# Patient Record
Sex: Female | Born: 1995 | Race: Black or African American | Hispanic: No | Marital: Single | Smoking: Never smoker
Health system: Southern US, Community
[De-identification: ages and names within clinical notes are randomized; demographics above are authoritative.]

## PROBLEM LIST (undated history)

## (undated) DIAGNOSIS — J45909 Unspecified asthma, uncomplicated: Secondary | ICD-10-CM

## (undated) HISTORY — PX: NO PAST SURGERIES: SHX2092

---
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
## 2015-08-07 ENCOUNTER — Encounter (HOSPITAL_COMMUNITY): Payer: Self-pay | Admitting: *Deleted

## 2015-08-07 ENCOUNTER — Inpatient Hospital Stay (HOSPITAL_COMMUNITY)
Admission: AD | Admit: 2015-08-07 | Discharge: 2015-08-07 | Disposition: A | Discharge status: Home / Self Care | Payer: Medicaid Other | Source: Ambulatory Visit | Attending: Obstetrics and Gynecology | Admitting: Obstetrics and Gynecology

## 2015-08-07 DIAGNOSIS — N926 Irregular menstruation, unspecified: Secondary | ICD-10-CM | POA: Insufficient documentation

## 2015-08-07 DIAGNOSIS — Z3202 Encounter for pregnancy test, result negative: Secondary | ICD-10-CM | POA: Diagnosis not present

## 2015-08-07 DIAGNOSIS — N898 Other specified noninflammatory disorders of vagina: Secondary | ICD-10-CM | POA: Diagnosis present

## 2015-08-07 DIAGNOSIS — J45909 Unspecified asthma, uncomplicated: Secondary | ICD-10-CM | POA: Insufficient documentation

## 2015-08-07 HISTORY — DX: Unspecified asthma, uncomplicated: J45.909

## 2015-08-07 LAB — URINALYSIS, ROUTINE W REFLEX MICROSCOPIC
BILIRUBIN URINE: NEGATIVE
GLUCOSE, UA: NEGATIVE mg/dL
Ketones, ur: NEGATIVE mg/dL
Leukocytes, UA: NEGATIVE
Nitrite: NEGATIVE
PROTEIN: NEGATIVE mg/dL
SPECIFIC GRAVITY, URINE: 1.015 (ref 1.005–1.030)
pH: 6.5 (ref 5.0–8.0)

## 2015-08-07 LAB — POCT PREGNANCY, URINE: Preg Test, Ur: NEGATIVE

## 2015-08-07 LAB — URINE MICROSCOPIC-ADD ON

## 2015-08-07 LAB — WET PREP, GENITAL
Clue Cells Wet Prep HPF POC: NONE SEEN
SPERM: NONE SEEN
Trich, Wet Prep: NONE SEEN
Yeast Wet Prep HPF POC: NONE SEEN

## 2015-08-07 NOTE — MAU Provider Note (Signed)
Chief Complaint: Vaginal Bleeding   First Provider Initiated Contact with Patient 08/07/15 1027     SUBJECTIVE HPI: Briscoe BurnsJashana Randolph is a [age] y.o. female who presents to [facility] reporting dark menstrual blood w/ something mixed in with it. LMP started yesterday, normal time. Only sexually active with female partner.  Severity: Moderate Duration: <24 hours Associated signs and symptoms: Pos for vaginal discharge, small clots. Neg for fever, chills, abd pain, vaginal irritation, vaginal odor.   Past Medical History  Diagnosis Date  . Asthma    OB History  No data available   Past Surgical History  Procedure Laterality Date  . No past surgeries     Social History   Social History  . Marital Status: Single    Spouse Name: N/A  . Number of Children: N/A  . Years of Education: N/A   Occupational History  . Not on file.   Social History Main Topics  . Smoking status: Never Smoker   . Smokeless tobacco: Not on file  . Alcohol Use: No  . Drug Use: No  . Sexual Activity: Not on file   Other Topics Concern  . Not on file   Social History Narrative  . No narrative on file   No current facility-administered medications on file prior to encounter.   No current outpatient prescriptions on file prior to encounter.   No Known Allergies  I have reviewed the past Medical Hx, Surgical Hx, Social Hx, Allergies and Medications.   Review of Systems  Constitutional: Negative for fever and chills.  Gastrointestinal: Negative for abdominal pain.  Genitourinary: Positive for vaginal bleeding, vaginal discharge and menstrual problem. Negative for genital sores, vaginal pain and pelvic pain.  Neurological: Negative for dizziness.    OBJECTIVE Patient Vitals for the past 24 hrs:  BP Temp Temp src Pulse Resp Height Weight  08/07/15 1115 102/71 mmHg - - 65 - - -  08/07/15 0954 98/65 mmHg 97.9 F (36.6 C) Oral 67 16 - -  08/07/15 0948 - - - - - 5\' 8"  (1.727 m) 127 lb 12.8  oz (57.97 kg)   Constitutional: Well-developed, well-nourished female in no acute distress.  Cardiovascular: normal rate Respiratory: normal rate and effort.  GI: Abd soft, non-tender. MS: Extremities nontender, no edema, normal ROM Neurologic: Alert and oriented x 4.  GU:  SPECULUM EXAM: NEFG, small amount of brown blood noted, cervix clean  BIMANUAL: cervix closed; uterus normal size, no adnexal tenderness or masses. No CMT.  LAB RESULTS Results for orders placed or performed during the hospital encounter of 08/07/15 (from the past 24 hour(s))  Wet prep, genital     Status: Abnormal   Collection Time: 08/07/15 10:35 AM  Result Value Ref Range   Yeast Wet Prep HPF POC NONE SEEN NONE SEEN   Trich, Wet Prep NONE SEEN NONE SEEN   Clue Cells Wet Prep HPF POC NONE SEEN NONE SEEN   WBC, Wet Prep HPF POC FEW (A) NONE SEEN   Sperm NONE SEEN   Urinalysis, Routine w reflex microscopic (not [facility])     Status: Abnormal   Collection Time: 08/07/15 10:45 AM  Result Value Ref Range   Color, Urine YELLOW YELLOW   APPearance CLEAR CLEAR   Specific Gravity, Urine 1.015 1.005 - 1.030   pH 6.5 5.0 - 8.0   Glucose, UA NEGATIVE NEGATIVE mg/dL   Hgb urine dipstick LARGE (A) NEGATIVE   Bilirubin Urine NEGATIVE NEGATIVE   Ketones, ur NEGATIVE NEGATIVE mg/dL  Protein, ur NEGATIVE NEGATIVE mg/dL   Nitrite NEGATIVE NEGATIVE   Leukocytes, UA NEGATIVE NEGATIVE  Urine microscopic-add on     Status: Abnormal   Collection Time: 08/07/15 10:45 AM  Result Value Ref Range   Squamous Epithelial / LPF 0-5 (A) NONE SEEN   WBC, UA 0-5 0 - 5 WBC/hpf   RBC / HPF 0-5 0 - 5 RBC/hpf   Bacteria, UA FEW (A) NONE SEEN  Pregnancy, urine POC     Status: None   Collection Time: 08/07/15 10:50 AM  Result Value Ref Range   Preg Test, Ur NEGATIVE NEGATIVE    IMAGING No results found.  MAU COURSE CBC, pelvic exam, Wet prep, GC/Chlamydia cultures.  MDM - Small amount of dark menstrual blood, but exam otherwise  normal. Unsure why and stroke blood is darker than usual, but no evidence of infection or emergent condition.  ASSESSMENT 1. Abnormal menstruation     PLAN Discharge home in stable condition. Patient could not stay for results because she needed to get to work. Will call with any positive results. List of providers given. Discussed indications for ED visits and encourage patient to utilize gynecologist for nonemergent GYN care. Follow-up Information    Follow up with Gynecologist .   Why:  For non-emergent gynecology care      Follow up with THE [facility] [place] [facility]ERN[facility] ADMISS[facility].   Why:  As needed in emergencies   Contact information:   [address] [id number] mc [place] [address] [phone]          Follow-up Information    Follow up with Gynecologist .   Why:  For non-emergent gynecology care      Follow up with THE [facility] [place] [facility]ERN[facility] ADMISS[facility].   Why:  As needed in emergencies   Contact information:   [address] [id number] mc [place] [address] [phone]       Medication List    Notice    You have not been prescribed any medications.       [place], [place] 08/07/2015  8:23 PM

## 2015-08-07 NOTE — Discharge Instructions (Signed)

## 2015-08-07 NOTE — MAU Note (Signed)
Pt states her period color is not normal.  Pt states it is dark brown.  Pt states there are some small clots but there is also something else mixed in with the blood and she is unsure of what it is.  Pt states her period started yesterday.

## 2015-08-08 LAB — HIV ANTIBODY (ROUTINE TESTING W REFLEX): HIV Screen 4th Generation wRfx: NONREACTIVE

## 2015-08-09 LAB — GC/CHLAMYDIA PROBE AMP (~~LOC~~) NOT AT ARMC
Chlamydia: NEGATIVE
Neisseria Gonorrhea: NEGATIVE

## 2017-08-10 ENCOUNTER — Encounter (HOSPITAL_COMMUNITY): Payer: Self-pay

## 2017-08-10 ENCOUNTER — Telehealth (HOSPITAL_COMMUNITY): Payer: Self-pay

## 2017-08-10 ENCOUNTER — Ambulatory Visit (HOSPITAL_COMMUNITY)
Admission: EM | Admit: 2017-08-10 | Discharge: 2017-08-10 | Disposition: A | Discharge status: Home / Self Care | Payer: Self-pay | Attending: Family Medicine | Admitting: Family Medicine

## 2017-08-10 DIAGNOSIS — J4521 Mild intermittent asthma with (acute) exacerbation: Secondary | ICD-10-CM

## 2017-08-10 DIAGNOSIS — B349 Viral infection, unspecified: Secondary | ICD-10-CM

## 2017-08-10 MED ORDER — TRAMADOL HCL 50 MG PO TABS: 50.0000 mg | ORAL_TABLET | Freq: Four times a day (QID) | ORAL | 0 refills | Status: DC | PRN

## 2017-08-10 MED ORDER — PREDNISONE 20 MG PO TABS: 20.0000 mg | ORAL_TABLET | Freq: Two times a day (BID) | ORAL | 0 refills | Status: DC

## 2017-08-10 NOTE — Telephone Encounter (Signed)
Pt requesting pharmacy change

## 2017-08-10 NOTE — Discharge Instructions (Signed)
Continue to rest.  Push fluids. Take tramadol as needed for pain.  Take with food Take prednisone twice a day for 5 days.  This is for the asthma and shortness of breath Continue inhaler as needed Return if not better by Monday

## 2017-08-10 NOTE — ED Provider Notes (Signed)
[facility]    CSN: [id number] Arrival date & time: 08/10/17  0908     History   Chief Complaint Chief Complaint  Patient presents with  . Shortness of Breath    HPI Lori Randolph is a 22 y.o. female.   HPI  Patient is here for a respiratory infection.  She has cough, chest tightness, and some increased wheezing since yesterday.  She started off with cold symptoms last week runny and stuffy nose and a mild sore throat.  No fever or chills.  No sputum production.  She states she does have a history of asthma.  She does have an inhaler at home.  She has been using her inhaler.  She is very tired today.  She is home from work.  She has a headache and some body aches.  No chest pain with deep breathing coughing.  Past Medical History:  Diagnosis Date  . Asthma     There are no active problems to display for this patient.   Past Surgical History:  Procedure Laterality Date  . NO PAST SURGERIES      OB History   None      Home Medications    Prior to Admission medications   Medication Sig Start Date End Date Taking? Authorizing Provider  predniSONE (DELTASONE) 20 MG tablet Take 1 tablet (20 mg total) by mouth 2 (two) times daily with a meal. 08/10/17   Eustace MooreNelson, Yvonne Sue, MD  traMADol (ULTRAM) 50 MG tablet Take 1 tablet (50 mg total) by mouth every 6 (six) hours as needed. 08/10/17   Eustace MooreNelson, Yvonne Sue, MD    Family History Family History  Problem Relation Age of Onset  . Healthy Mother   Asthma in family  Social History Social History   Tobacco Use  . Smoking status: Never Smoker  Substance Use Topics  . Alcohol use: No  . Drug use: No     Allergies   Ibuprofen Patient had face and lip swelling with ibuprofen states she had some difficulty breathing.  We discussed a Toradol shot, and I think it is contraindicated.  Review of Systems Review of Systems  Constitutional: Positive for fatigue. Negative for chills and fever.  HENT: Negative for  congestion, dental problem, ear pain, postnasal drip, rhinorrhea, sinus pain and sore throat.   Eyes: Negative for pain and visual disturbance.  Respiratory: Positive for chest tightness and wheezing. Negative for cough and shortness of breath.   Cardiovascular: Negative for chest pain and palpitations.  Gastrointestinal: Negative for abdominal pain and vomiting.  Genitourinary: Negative for dysuria and hematuria.  Musculoskeletal: Negative for arthralgias and back pain.  Skin: Negative for color change and rash.  Neurological: Positive for headaches. Negative for seizures and syncope.  All other systems reviewed and are negative.    Physical Exam Triage Vital Signs ED Triage Vitals  Enc Vitals Group     BP 08/10/17 0930 124/74     Pulse Rate 08/10/17 0930 72     Resp 08/10/17 0930 18     Temp 08/10/17 0930 97.9 F (36.6 C)     Temp Source 08/10/17 0930 Oral     SpO2 08/10/17 0930 98 %     Weight --      Height --      Head Circumference --      Peak Flow --      Pain Score 08/10/17 0934 8     Pain Loc --  Pain Edu? --      Excl. in GC? --    No data found.  Updated Vital Signs BP 124/74 (BP Location: Right Arm)   Pulse 72   Temp 97.9 F (36.6 C) (Oral)   Resp 18   LMP 08/03/2017   SpO2 98%       Physical Exam  Constitutional: She is oriented to person, place, and time. She appears well-developed and well-nourished. She does not appear ill. No distress.  Appears fatigued  HENT:  Head: Normocephalic and atraumatic.  Mouth/Throat: Oropharynx is clear and moist. No oropharyngeal exudate or posterior oropharyngeal edema.  Eyes: Pupils are equal, round, and reactive to light. Conjunctivae and EOM are normal.  Neck: Normal range of motion.  Cardiovascular: Normal rate, regular rhythm and normal heart sounds.  Pulmonary/Chest: Effort normal. No respiratory distress.  Few anterior wheezes  Abdominal: Soft. She exhibits no distension.  Musculoskeletal: Normal  range of motion. She exhibits no edema.       Right lower leg: Normal.       Left lower leg: Normal.  Lymphadenopathy:    She has no cervical adenopathy.  Neurological: She is alert and oriented to person, place, and time.  Skin: Skin is warm and dry.  Psychiatric: She has a normal mood and affect. Her behavior is normal.     UC Treatments / Results  Labs (all labs ordered are listed, but only abnormal results are displayed) Labs Reviewed - No data to display  EKG None  Radiology No results found.  Procedures Procedures (including critical care time)  Medications Ordered in UC Medications - No data to display  Initial Impression / Assessment and Plan / UC Course  I have reviewed the triage vital signs and the nursing notes.  Pertinent labs & imaging results that were available during my care of the patient were reviewed by me and considered in my medical decision making (see chart for details).      Final Clinical Impressions(s) / UC Diagnoses   Final diagnoses:  Viral illness  Mild intermittent asthma with acute exacerbation     Discharge Instructions     Continue to rest.  Push fluids. Take tramadol as needed for pain.  Take with food Take prednisone twice a day for 5 days.  This is for the asthma and shortness of breath Continue inhaler as needed Return if not better by Monday   ED Prescriptions    Medication Sig Dispense Auth. Provider   predniSONE (DELTASONE) 20 MG tablet Take 1 tablet (20 mg total) by mouth 2 (two) times daily with a meal. 10 tablet Eustace Moore, MD   traMADol (ULTRAM) 50 MG tablet Take 1 tablet (50 mg total) by mouth every 6 (six) hours as needed. 15 tablet Eustace Moore, MD     Controlled Substance Prescriptions [place] Controlled Substance Registry consulted? Yes, I have consulted the [place] Controlled Substances Registry for this patient, and feel the risk/benefit ratio today is favorable for proceeding with this prescription  for a controlled substance.   Eustace Moore, MD 08/10/17 [phone]

## 2017-08-10 NOTE — ED Triage Notes (Signed)
Pt presents with body aches, shortness of breath, chest tightness and stuff nose x 1 day.

## 2017-10-09 ENCOUNTER — Ambulatory Visit (HOSPITAL_COMMUNITY)
Admission: EM | Admit: 2017-10-09 | Discharge: 2017-10-09 | Disposition: A | Discharge status: Home / Self Care | Payer: Medicaid Other | Attending: Family Medicine | Admitting: Family Medicine

## 2017-10-09 ENCOUNTER — Encounter (HOSPITAL_COMMUNITY): Payer: Self-pay | Admitting: Emergency Medicine

## 2017-10-09 DIAGNOSIS — S61001A Unspecified open wound of right thumb without damage to nail, initial encounter: Secondary | ICD-10-CM

## 2017-10-09 DIAGNOSIS — L03011 Cellulitis of right finger: Secondary | ICD-10-CM

## 2017-10-09 MED ORDER — MUPIROCIN 2 % EX OINT: 1.0000 "application " | TOPICAL_OINTMENT | Freq: Two times a day (BID) | CUTANEOUS | 0 refills | Status: DC

## 2017-10-09 MED ORDER — AMOXICILLIN-POT CLAVULANATE 875-125 MG PO TABS: 1.0000 | ORAL_TABLET | Freq: Two times a day (BID) | ORAL | 0 refills | Status: DC

## 2017-10-09 NOTE — ED Provider Notes (Signed)
[facility]    CSN: [id number] Arrival date & time: 10/09/17  1743     History   Chief Complaint Chief Complaint  Patient presents with  . Nail Problem    HPI Lori Randolph is a 22 y.o. female.   22 year old female comes in for possible infection to her right thumbnail.  States she bit off a hangnail yesterday, and had some drainage to the area last night.  Noticed swelling to the area today, and was told to come in for evaluation.  Denies erythema, warmth.  Denies fever, chills, night sweats.  Has not done anything for the symptoms.     Past Medical History:  Diagnosis Date  . Asthma     There are no active problems to display for this patient.   Past Surgical History:  Procedure Laterality Date  . NO PAST SURGERIES      OB History   None      Home Medications    Prior to Admission medications   Medication Sig Start Date End Date Taking? Authorizing Provider  albuterol (PROVENTIL HFA;VENTOLIN HFA) 108 (90 Base) MCG/ACT inhaler Inhale into the lungs every 6 (six) hours as needed for wheezing or shortness of breath.   Yes [provider]  amoxicillin-clavulanate (AUGMENTIN) 875-125 MG tablet Take 1 tablet by mouth every 12 (twelve) hours. 10/09/17   Cathie Hoops, Jauna Raczynski V, PA-C  mupirocin ointment (BACTROBAN) 2 % Apply 1 application topically 2 (two) times daily. 10/09/17   Belinda Fisher, PA-C    Family History Family History  Problem Relation Age of Onset  . Healthy Mother     Social History Social History   Tobacco Use  . Smoking status: Never Smoker  Substance Use Topics  . Alcohol use: No  . Drug use: No     Allergies   Ibuprofen   Review of Systems Review of Systems  Reason unable to perform ROS: See HPI as above.     Physical Exam Triage Vital Signs ED Triage Vitals [10/09/17 1819]  Enc Vitals Group     BP 126/65     Pulse Rate 60     Resp 16     Temp 98 F (36.7 C)     Temp src      SpO2 100 %     Weight      Height        Head Circumference      Peak Flow      Pain Score      Pain Loc      Pain Edu?      Excl. in GC?    No data found.  Updated Vital Signs BP 126/65   Pulse 60   Temp 98 F (36.7 C)   Resp 16   SpO2 100%   Physical Exam  Constitutional: She is oriented to person, place, and time. She appears well-developed and well-nourished. No distress.  HENT:  Head: Normocephalic and atraumatic.  Eyes: Pupils are equal, round, and reactive to light. Conjunctivae are normal.  Musculoskeletal:  Mild swelling to the medial aspect of the right thumb adjacent to the nail bed. No erythema, warmth. No fluctuance felt. Full ROM of finger, sensation intact. Radial pulse 2+, cap refill <2s  Neurological: She is alert and oriented to person, place, and time.  Skin: She is not diaphoretic.     UC Treatments / Results  Labs (all labs ordered are listed, but only abnormal results are displayed) Labs Reviewed -  No data to display  EKG None  Radiology No results found.  Procedures Procedures (including critical care time)  Medications Ordered in UC Medications - No data to display  Initial Impression / Assessment and Plan / UC Course  I have reviewed the triage vital signs and the nursing notes.  Pertinent labs & imaging results that were available during my care of the patient were reviewed by me and considered in my medical decision making (see chart for details).     No paronychia today.  Will have patient use Bactroban ointment, warm compress.  Other wound care instructions given.  Rx of Augmentin provided, patient can fill if worsening symptoms, increased swelling, warmth.  Return precautions given.  Patient expresses understanding and agrees to plan.  Final Clinical Impressions(s) / UC Diagnoses   Final diagnoses:  Paronychia of finger of right hand    ED Prescriptions    Medication Sig Dispense Auth. Provider   mupirocin ointment (BACTROBAN) 2 % Apply 1 application topically  2 (two) times daily. 22 g Dara Beidleman V, PA-C   amoxicillin-clavulanate (AUGMENTIN) 875-125 MG tablet Take 1 tablet by mouth every 12 (twelve) hours. 14 tablet Threasa Alpha, [place] 10/09/17 1844

## 2017-10-09 NOTE — ED Triage Notes (Signed)
Pt here for R thumb nail infection, swelling around nail bed.

## 2017-10-09 NOTE — Discharge Instructions (Signed)
Start bactroban ointment as directed. Warm compress to the area. If with increasing swelling/redness, start augmentin as directed. If symptoms still not improving, has discoloration, follow up for reevaluation needed.

## 2017-10-18 ENCOUNTER — Emergency Department (HOSPITAL_BASED_OUTPATIENT_CLINIC_OR_DEPARTMENT_OTHER): Payer: Medicaid Other

## 2017-10-18 ENCOUNTER — Encounter (HOSPITAL_BASED_OUTPATIENT_CLINIC_OR_DEPARTMENT_OTHER): Payer: Self-pay

## 2017-10-18 ENCOUNTER — Other Ambulatory Visit: Payer: Self-pay

## 2017-10-18 ENCOUNTER — Emergency Department (HOSPITAL_BASED_OUTPATIENT_CLINIC_OR_DEPARTMENT_OTHER)
Admission: EM | Admit: 2017-10-18 | Discharge: 2017-10-19 | Disposition: A | Discharge status: Home / Self Care | Payer: Medicaid Other | Attending: Emergency Medicine | Admitting: Emergency Medicine

## 2017-10-18 DIAGNOSIS — R109 Unspecified abdominal pain: Secondary | ICD-10-CM | POA: Diagnosis present

## 2017-10-18 DIAGNOSIS — Z79899 Other long term (current) drug therapy: Secondary | ICD-10-CM | POA: Diagnosis not present

## 2017-10-18 DIAGNOSIS — B349 Viral infection, unspecified: Secondary | ICD-10-CM | POA: Insufficient documentation

## 2017-10-18 DIAGNOSIS — J45909 Unspecified asthma, uncomplicated: Secondary | ICD-10-CM | POA: Insufficient documentation

## 2017-10-18 DIAGNOSIS — N3001 Acute cystitis with hematuria: Secondary | ICD-10-CM | POA: Insufficient documentation

## 2017-10-18 DIAGNOSIS — J029 Acute pharyngitis, unspecified: Secondary | ICD-10-CM | POA: Insufficient documentation

## 2017-10-18 DIAGNOSIS — A599 Trichomoniasis, unspecified: Secondary | ICD-10-CM | POA: Diagnosis not present

## 2017-10-18 LAB — CBC WITH DIFFERENTIAL/PLATELET
BASOS ABS: 0 10*3/uL (ref 0.0–0.1)
BASOS PCT: 0 %
EOS PCT: 0 %
Eosinophils Absolute: 0 10*3/uL (ref 0.0–0.7)
HCT: 38.7 % (ref 36.0–46.0)
Hemoglobin: 13.2 g/dL (ref 12.0–15.0)
LYMPHS ABS: 1.8 10*3/uL (ref 0.7–4.0)
Lymphocytes Relative: 10 %
MCH: 31.7 pg (ref 26.0–34.0)
MCHC: 34.1 g/dL (ref 30.0–36.0)
MCV: 92.8 fL (ref 78.0–100.0)
Monocytes Absolute: 1.6 10*3/uL — ABNORMAL HIGH (ref 0.1–1.0)
Monocytes Relative: 9 %
NEUTROS PCT: 81 %
Neutro Abs: 14.5 10*3/uL — ABNORMAL HIGH (ref 1.7–7.7)
PLATELETS: 245 10*3/uL (ref 150–400)
RBC: 4.17 MIL/uL (ref 3.87–5.11)
RDW: 12 % (ref 11.5–15.5)
WBC: 18 10*3/uL — AB (ref 4.0–10.5)

## 2017-10-18 LAB — COMPREHENSIVE METABOLIC PANEL
ALT: 9 U/L (ref 0–44)
ANION GAP: 11 (ref 5–15)
AST: 20 U/L (ref 15–41)
Albumin: 4.1 g/dL (ref 3.5–5.0)
Alkaline Phosphatase: 62 U/L (ref 38–126)
BUN: 13 mg/dL (ref 6–20)
CALCIUM: 9 mg/dL (ref 8.9–10.3)
CO2: 24 mmol/L (ref 22–32)
Chloride: 101 mmol/L (ref 98–111)
Creatinine, Ser: 0.79 mg/dL (ref 0.44–1.00)
Glucose, Bld: 126 mg/dL — ABNORMAL HIGH (ref 70–99)
Potassium: 3.1 mmol/L — ABNORMAL LOW (ref 3.5–5.1)
SODIUM: 136 mmol/L (ref 135–145)
Total Bilirubin: 1.2 mg/dL (ref 0.3–1.2)
Total Protein: 7.6 g/dL (ref 6.5–8.1)

## 2017-10-18 LAB — GROUP A STREP BY PCR: GROUP A STREP BY PCR: NOT DETECTED

## 2017-10-18 LAB — PREGNANCY, URINE: PREG TEST UR: NEGATIVE

## 2017-10-18 LAB — LIPASE, BLOOD: LIPASE: 24 U/L (ref 11–51)

## 2017-10-18 MED ORDER — ACETAMINOPHEN 160 MG/5ML PO SOLN
1000.0000 mg | Freq: Once | ORAL | Status: AC
  Administered 2017-10-18: 1000 mg via ORAL
  Filled 2017-10-18: qty 40.6

## 2017-10-18 MED ORDER — DEXAMETHASONE SODIUM PHOSPHATE 10 MG/ML IJ SOLN
10.0000 mg | Freq: Once | INTRAMUSCULAR | Status: AC
  Administered 2017-10-18: 10 mg via INTRAMUSCULAR
  Filled 2017-10-18: qty 1

## 2017-10-18 MED ORDER — SODIUM CHLORIDE 0.9 % IV BOLUS
1000.0000 mL | Freq: Once | INTRAVENOUS | Status: AC
Start: 2017-10-18 — End: 2017-10-18
  Administered 2017-10-18: 1000 mL via INTRAVENOUS

## 2017-10-18 MED ORDER — METHOCARBAMOL 1000 MG/10ML IJ SOLN
500.0000 mg | Freq: Once | INTRAMUSCULAR | Status: AC
  Administered 2017-10-18: 500 mg via INTRAMUSCULAR
  Filled 2017-10-18: qty 10

## 2017-10-18 MED ORDER — IOPAMIDOL (ISOVUE-300) INJECTION 61%
100.0000 mL | Freq: Once | INTRAVENOUS | Status: AC | PRN
  Administered 2017-10-18: 100 mL via INTRAVENOUS

## 2017-10-18 NOTE — ED Notes (Signed)
Advised pt in triage to try to unbundle from her blanket and sweatshirt d/t her fever, pt has elected not to take nurse's advice

## 2017-10-18 NOTE — ED Provider Notes (Signed)
[facility] EMERGENCY DEPARTMENT Provider Note   CSN: [id number] Arrival date & time: 10/18/17  2041     History   Chief Complaint Chief Complaint  Patient presents with  . Sore Throat  . Abdominal Pain    HPI Lori Randolph is a 22 y.o. female with PMH/o asthma presents for evaluation of sore throat, generalized malaise, fevers, cough, nasal congestion, rhinorrhea that has been ongoing since yesterday.  Patient states that it is more painful to swallow she has been able to tolerate her secretions and p.o.  She has been taking Tylenol for sore throat with minimal improvement.  She states she has not had any Tylenol or ibuprofen today prior to ED arrival.  She reports measuring a fever last night at 101.2.  She also reports that she started having some abdominal pain that began last night also.  Patient states she has had some cough but is not productive.  She has just felt weak and rundown all over.  She had some tingling sensation in the left side of her body earlier today but no numbness/weakness.  Patient states she is a history of asthma but does not feel any wheezing or anything.  Patient denies any chest pain, difficulty breathing, nausea/vomiting.  The history is provided by the patient.    Past Medical History:  Diagnosis Date  . Asthma     There are no active problems to display for this patient.   Past Surgical History:  Procedure Laterality Date  . NO PAST SURGERIES       OB History   None      Home Medications    Prior to Admission medications   Medication Sig Start Date End Date Taking? Authorizing Provider  albuterol (PROVENTIL HFA;VENTOLIN HFA) 108 (90 Base) MCG/ACT inhaler Inhale into the lungs every 6 (six) hours as needed for wheezing or shortness of breath.    [provider]  amoxicillin-clavulanate (AUGMENTIN) 875-125 MG tablet Take 1 tablet by mouth every 12 (twelve) hours. 10/09/17   Cathie Hoops, Amy V, PA-C  cephALEXin (KEFLEX) 500 MG  capsule Take 1 capsule (500 mg total) by mouth 2 (two) times daily. 10/19/17   Maxwell Caul, PA-C  metroNIDAZOLE (FLAGYL) 500 MG tablet Take 1 tablet (500 mg total) by mouth 2 (two) times daily for 7 days. 10/19/17 10/26/17  Maxwell Caul, PA-C  mupirocin ointment (BACTROBAN) 2 % Apply 1 application topically 2 (two) times daily. 10/09/17   Belinda Fisher, PA-C    Family History Family History  Problem Relation Age of Onset  . Healthy Mother     Social History Social History   Tobacco Use  . Smoking status: Never Smoker  Substance Use Topics  . Alcohol use: No  . Drug use: No     Allergies   Ibuprofen   Review of Systems Review of Systems  Constitutional: Positive for fatigue and fever.  HENT: Positive for sore throat.   Respiratory: Positive for cough. Negative for shortness of breath.   Cardiovascular: Negative for chest pain.  Gastrointestinal: Negative for abdominal pain, nausea and vomiting.  Genitourinary: Negative for dysuria and hematuria.  Musculoskeletal: Positive for myalgias.  Neurological: Negative for headaches.  All other systems reviewed and are negative.    Physical Exam Updated Vital Signs BP 114/80 (BP Location: Right Arm)   Pulse 77   Temp 98.4 F (36.9 C) (Oral)   Resp 16   Ht 5\' 8"  (1.727 m)   Wt 59 kg  LMP 10/10/2017   SpO2 99%   BMI 19.77 kg/m   Physical Exam  Constitutional: She is oriented to person, place, and time. She appears well-developed and well-nourished.  HENT:  Head: Normocephalic and atraumatic.  Mouth/Throat: Uvula is midline and mucous membranes are normal. No trismus in the jaw. Posterior oropharyngeal edema and posterior oropharyngeal erythema present.  Posterior oropharynx is erythematous, edematous.  Tonsils are symmetric in appearance.  Uvula is midline.  No trismus.  Airways patent, phonation is intact.  Eyes: Pupils are equal, round, and reactive to light. Conjunctivae, EOM and lids are normal.  Neck: Full  passive range of motion without pain.  Cardiovascular: Normal rate, regular rhythm, normal heart sounds and normal pulses. Exam reveals no gallop and no friction rub.  No murmur heard. Pulmonary/Chest: Effort normal and breath sounds normal.  Lungs clear to auscultation bilaterally.  Symmetric chest rise.  No wheezing, rales, rhonchi.  Abdominal: Soft. Normal appearance. There is tenderness. There is tenderness at McBurney's point. There is no rigidity, no guarding and no CVA tenderness.  Abdomen is soft, nondistended.  Tenderness noted at McBurney's point.  No rebounding, guarding.  Negative Rovsing's.  CVA tenderness bilaterally.  Musculoskeletal: Normal range of motion.  Neurological: She is alert and oriented to person, place, and time.  Skin: Skin is warm and dry. Capillary refill takes less than 2 seconds.  Psychiatric: She has a normal mood and affect. Her speech is normal.  Nursing note and vitals reviewed.    ED Treatments / Results  Labs (all labs ordered are listed, but only abnormal results are displayed) Labs Reviewed  COMPREHENSIVE METABOLIC PANEL - Abnormal; Notable for the following components:      Result Value   Potassium 3.1 (*)    Glucose, Bld 126 (*)    All other components within normal limits  CBC WITH DIFFERENTIAL/PLATELET - Abnormal; Notable for the following components:   WBC 18.0 (*)    Neutro Abs 14.5 (*)    Monocytes Absolute 1.6 (*)    All other components within normal limits  URINALYSIS, ROUTINE W REFLEX MICROSCOPIC - Abnormal; Notable for the following components:   APPearance HAZY (*)    Hgb urine dipstick TRACE (*)    Ketones, ur 15 (*)    Protein, ur 30 (*)    Leukocytes, UA MODERATE (*)    All other components within normal limits  URINALYSIS, MICROSCOPIC (REFLEX) - Abnormal; Notable for the following components:   Bacteria, UA FEW (*)    Trichomonas, UA PRESENT (*)    All other components within normal limits  GROUP A STREP BY PCR    LIPASE, BLOOD  PREGNANCY, URINE    EKG None  Radiology Dg Chest 2 View  Result Date: 10/18/2017 CLINICAL DATA:  Cough EXAM: CHEST - 2 VIEW COMPARISON:  None. FINDINGS: The heart size and mediastinal contours are within normal limits. Both lungs are clear. The visualized skeletal structures are unremarkable. IMPRESSION: No active cardiopulmonary disease. Electronically Signed   By: Deatra Robinson M.D.   On: 10/18/2017 21:45   Ct Abdomen Pelvis W Contrast  Result Date: 10/18/2017 CLINICAL DATA:  Abdominal pain with fever EXAM: CT ABDOMEN AND PELVIS WITH CONTRAST TECHNIQUE: Multidetector CT imaging of the abdomen and pelvis was performed using the standard protocol following bolus administration of intravenous contrast. CONTRAST:  ISOVUE-300 IOPAMIDOL (ISOVUE-300) INJECTION 61% COMPARISON:  None. FINDINGS: Lower chest: No acute abnormality. Hepatobiliary: No focal liver abnormality is seen. No gallstones, gallbladder wall thickening, or  biliary dilatation. Pancreas: Unremarkable. No pancreatic ductal dilatation or surrounding inflammatory changes. Spleen: Normal in size without focal abnormality. Adrenals/Urinary Tract: Adrenal glands are unremarkable. Kidneys are normal, without renal calculi, focal lesion, or hydronephrosis. Bladder is unremarkable. Stomach/Bowel: Stomach is within normal limits. Appendix appears normal. No evidence of bowel wall thickening, distention, or inflammatory changes. Vascular/Lymphatic: No significant vascular findings are present. No enlarged abdominal or pelvic lymph nodes. Reproductive: Uterus and bilateral adnexa are unremarkable. Other: Negative for free air.  Trace free fluid in the pelvis. Musculoskeletal: No acute or significant osseous findings. IMPRESSION: 1. No CT evidence for acute intra-abdominal or intrapelvic abnormality. 2. Trace free fluid in the pelvis Electronically Signed   By: Jasmine PangKim  Fujinaga M.D.   On: 10/18/2017 23:39    Procedures Procedures  (including critical care time)  Medications Ordered in ED Medications  acetaminophen (TYLENOL) solution 1,000 mg (1,000 mg Oral Given 10/18/17 2058)  dexamethasone (DECADRON) injection 10 mg (10 mg Intramuscular Given 10/18/17 2150)  methocarbamol (ROBAXIN) injection 500 mg (500 mg Intramuscular Given 10/18/17 2153)  sodium chloride 0.9 % bolus 1,000 mL ( Intravenous Stopped 10/18/17 2355)  iopamidol (ISOVUE-300) 61 % injection 100 mL (100 mLs Intravenous Contrast Given 10/18/17 2326)     Initial Impression / Assessment and Plan / ED Course  I have reviewed the triage vital signs and the nursing notes.  Pertinent labs & imaging results that were available during my care of the patient were reviewed by me and considered in my medical decision making (see chart for details).     22 year old female who presents for evaluation of sore throat, abdominal pain, fever, cough that is been ongoing for last 2 days.  No vomiting, diarrhea. Patient is mild, so a tachycardic.  She appears uncomfortable but no acute distress.. Vital signs reviewed and stable.  Exam, patient has posterior oropharynx erythema, edema.  Uvula is midline.  No trismus.  Consider pharyngitis.  History/physical exam is not concerning for peritonsillar abscess, Ludwig angina.  On abdominal exam, patient has some tenderness noted to right lower quadrant, particularly McBurney's point.  No rigidity, guarding. Rapid strep ordered at triage.  We will plan to check chest x-ray, basic labs.  Given tenderness to right lower quadrant, will plan for CT and pelvis for evaluation of appendicitis.  Strep negative.  Chest x-ray negative for any acute abnormality.  CBC shows leukocytosis of 18.0.  TMP shows potassium 3.1.  Otherwise unremarkable.  Lipase unremarkable.  CT scan shows normal appendix.  No evidence of acute intra-abdominal process.  Discussed urine results with patient.  Offered to speak with her in private without her significant other  here but she states it was okay to speak in front of her.  Inserted patient to take antibiotics as directed.  Instructed that she should not have intercourse with her sexual partner until she finishes antibiotics and patient is treated.  Patient is resting comfortably in bed.  Reports improvement in pain.  Vital signs are stable.  Patient stable for discharge at this time.  Suspect viral process.  Encourage at home supportive care measures. Patient had ample opportunity for questions and discussion. All patient's questions were answered with full understanding. Strict return precautions discussed. Patient expresses understanding and agreement to plan.   Final Clinical Impressions(s) / ED Diagnoses   Final diagnoses:  Sore throat  Viral illness  Acute cystitis with hematuria  Trichimoniasis    ED Discharge Orders         Ordered  cephALEXin (KEFLEX) 500 MG capsule  2 times daily     10/19/17 0016    metroNIDAZOLE (FLAGYL) 500 MG tablet  2 times daily     10/19/17 0016           Maxwell Caul, PA-C 10/20/17 0134    Melene Plan, DO 10/23/17 1205

## 2017-10-18 NOTE — ED Notes (Signed)
Patient transported to CT 

## 2017-10-18 NOTE — ED Triage Notes (Signed)
Sore throat and fever for two days, no fever medication prior to arrival

## 2017-10-19 LAB — URINALYSIS, ROUTINE W REFLEX MICROSCOPIC
Bilirubin Urine: NEGATIVE
Glucose, UA: NEGATIVE mg/dL
KETONES UR: 15 mg/dL — AB
NITRITE: NEGATIVE
PH: 6.5 (ref 5.0–8.0)
Protein, ur: 30 mg/dL — AB
SPECIFIC GRAVITY, URINE: 1.025 (ref 1.005–1.030)

## 2017-10-19 LAB — URINALYSIS, MICROSCOPIC (REFLEX)

## 2017-10-19 MED ORDER — CEPHALEXIN 500 MG PO CAPS: 500.0000 mg | ORAL_CAPSULE | Freq: Two times a day (BID) | ORAL | 0 refills | Status: DC

## 2017-10-19 MED ORDER — METRONIDAZOLE 500 MG PO TABS: 500.0000 mg | ORAL_TABLET | Freq: Two times a day (BID) | ORAL | 0 refills | Status: AC

## 2017-10-19 NOTE — Discharge Instructions (Addendum)
You can take 1000 mg of Tylenol.  Do not exceed 4000 mg of Tylenol a day.  Sure you drink plenty of fluids and staying hydrated.  Take antibiotics as directed. Please take all of your antibiotics until finished. Take Flagyl as directed.  It is very important that you do not consume any alcohol while taking this medication as it will cause you to become violently ill.   Follow-up with [facility] for further evaluation.  Return the emergency department for any fever, worsening abdominal pain, vomiting, any other worsening or concerning symptoms

## 2018-01-31 ENCOUNTER — Encounter (HOSPITAL_COMMUNITY): Payer: Self-pay | Admitting: Student

## 2018-01-31 ENCOUNTER — Emergency Department (HOSPITAL_COMMUNITY)
Admission: EM | Admit: 2018-01-31 | Discharge: 2018-01-31 | Disposition: A | Discharge status: Home / Self Care | Payer: Medicaid Other | Attending: Emergency Medicine | Admitting: Emergency Medicine

## 2018-01-31 DIAGNOSIS — N898 Other specified noninflammatory disorders of vagina: Secondary | ICD-10-CM | POA: Diagnosis present

## 2018-01-31 DIAGNOSIS — N760 Acute vaginitis: Secondary | ICD-10-CM | POA: Diagnosis not present

## 2018-01-31 DIAGNOSIS — J45909 Unspecified asthma, uncomplicated: Secondary | ICD-10-CM | POA: Insufficient documentation

## 2018-01-31 DIAGNOSIS — B9689 Other specified bacterial agents as the cause of diseases classified elsewhere: Secondary | ICD-10-CM

## 2018-01-31 DIAGNOSIS — Z79899 Other long term (current) drug therapy: Secondary | ICD-10-CM | POA: Diagnosis not present

## 2018-01-31 LAB — WET PREP, GENITAL
Sperm: NONE SEEN
TRICH WET PREP: NONE SEEN
YEAST WET PREP: NONE SEEN

## 2018-01-31 LAB — URINALYSIS, ROUTINE W REFLEX MICROSCOPIC
Bilirubin Urine: NEGATIVE
Glucose, UA: NEGATIVE mg/dL
Hgb urine dipstick: NEGATIVE
KETONES UR: NEGATIVE mg/dL
LEUKOCYTES UA: NEGATIVE
NITRITE: NEGATIVE
PH: 7 (ref 5.0–8.0)
PROTEIN: NEGATIVE mg/dL
Specific Gravity, Urine: 1.019 (ref 1.005–1.030)

## 2018-01-31 LAB — POC URINE PREG, ED: Preg Test, Ur: NEGATIVE

## 2018-01-31 MED ORDER — METRONIDAZOLE 500 MG PO TABS: 500.0000 mg | ORAL_TABLET | Freq: Two times a day (BID) | ORAL | 0 refills | Status: DC

## 2018-01-31 NOTE — ED Triage Notes (Signed)
Pt reports vaginal discharge for 2 days, denies pain or itching. No other symptoms

## 2018-01-31 NOTE — ED Provider Notes (Signed)
[facility] Provider Note   CSN: [id number] Arrival date & time: 01/31/18  1220     History   Chief Complaint Chief Complaint  Patient presents with  . Vaginal Discharge    HPI Lori Randolph is a 23 y.o. female with a hx of asthma who presents to the ED with complaints of vaginal discharge for the past 2 to 3 days.  Patient describes the discharge as white, somewhat thick, and without specific alleviating or aggravating factors.  The discharge is not pruritic or painful.  She does not have a history of similar.  She was previously sexually active in a relationship with one female partner, no protection utilized, no known exposure to STDs.,  Recent break-up.  Denies fever, chills, nausea, vomiting, abdominal pain, pelvic pain, diarrhea or vaginal bleeding.  Last menstrual period finished a few days ago.  HPI  Past Medical History:  Diagnosis Date  . Asthma     There are no active problems to display for this patient.   Past Surgical History:  Procedure Laterality Date  . NO PAST SURGERIES       OB History   No obstetric history on file.      Home Medications    Prior to Admission medications   Medication Sig Start Date End Date Taking? Authorizing Provider  albuterol (PROVENTIL HFA;VENTOLIN HFA) 108 (90 Base) MCG/ACT inhaler Inhale into the lungs every 6 (six) hours as needed for wheezing or shortness of breath.    [provider]  amoxicillin-clavulanate (AUGMENTIN) 875-125 MG tablet Take 1 tablet by mouth every 12 (twelve) hours. 10/09/17   Cathie HoopsYu, Amy V, PA-C  cephALEXin (KEFLEX) 500 MG capsule Take 1 capsule (500 mg total) by mouth 2 (two) times daily. 10/19/17   Maxwell CaulLayden, Lindsey A, PA-C  mupirocin ointment (BACTROBAN) 2 % Apply 1 application topically 2 (two) times daily. 10/09/17   Belinda FisherYu, Amy V, PA-C    Family History Family History  Problem Relation Age of Onset  . Healthy Mother     Social History Social History    Tobacco Use  . Smoking status: Never Smoker  Substance Use Topics  . Alcohol use: No  . Drug use: No     Allergies   Ibuprofen   Review of Systems Review of Systems  Constitutional: Negative for chills and fever.  Gastrointestinal: Negative for abdominal pain, constipation, diarrhea, nausea and vomiting.  Genitourinary: Positive for vaginal discharge. Negative for dysuria, pelvic pain and vaginal bleeding.  All other systems reviewed and are negative.    Physical Exam Updated Vital Signs BP 121/76   Pulse 71   Temp 98.2 F (36.8 C) (Oral)   Resp 14   SpO2 100%   Physical Exam Vitals signs and nursing note reviewed. Exam conducted with a chaperone present.  Constitutional:      General: She is not in acute distress.    Appearance: She is well-developed. She is not toxic-appearing.  HENT:     Head: Normocephalic and atraumatic.  Eyes:     General:        Right eye: No discharge.        Left eye: No discharge.     Conjunctiva/sclera: Conjunctivae normal.  Neck:     Musculoskeletal: Neck supple.  Cardiovascular:     Rate and Rhythm: Normal rate and regular rhythm.  Pulmonary:     Effort: Pulmonary effort is normal. No respiratory distress.     Breath sounds: Normal breath sounds.  No wheezing, rhonchi or rales.  Abdominal:     General: There is no distension.     Palpations: Abdomen is soft.     Tenderness: There is no abdominal tenderness. There is no guarding or rebound.  Genitourinary:    Exam position: Supine.     Labia:        Right: No rash or lesion.        Left: No rash or lesion.      Vagina: Vaginal discharge (thin white, moderate amount) present. No erythema or lesions.     Cervix: No cervical motion tenderness or friability.     Adnexa:        Right: No mass or tenderness.         Left: No mass or tenderness.    Skin:    General: Skin is warm and dry.     Findings: No rash.  Neurological:     Mental Status: She is alert.     Comments:  Clear speech.   Psychiatric:        Behavior: Behavior normal.      ED Treatments / Results  Labs (all labs ordered are listed, but only abnormal results are displayed) Labs Reviewed  URINALYSIS, ROUTINE W REFLEX MICROSCOPIC - Abnormal; Notable for the following components:      Result Value   APPearance HAZY (*)    All other components within normal limits  WET PREP, GENITAL  RPR  HIV ANTIBODY (ROUTINE TESTING W REFLEX)  POC URINE PREG, ED  GC/CHLAMYDIA PROBE AMP ([facility]) NOT AT [facility]    EKG None  Radiology No results found.  Procedures Procedures (including critical care time)  Medications Ordered in ED Medications - No data to display   Initial Impression / Assessment and Plan / ED Course  I have reviewed the triage vital signs and the nursing notes.  Pertinent labs & imaging results that were available during my care of the patient were reviewed by me and considered in my medical decision making (see chart for details).   Patient presents to the emergency department with complaint of vaginal discharge. Pregnancy test negative. UA without infection. Wet prep w/ BV- likely cause- will tx with flagyl- discussed no EtOH with this medicine. GC, chlamydia, HIV, RPR pending, offered GC/chlamydia prophylaxis patient declined. Aware of pending tests and need to inform sexual partners & seek tx if positive. I discussed results, treatment plan, need for follow-up, and return precautions with the patient. Provided opportunity for questions, patient confirmed understanding and is in agreement with plan.   Final Clinical Impressions(s) / ED Diagnoses   Final diagnoses:  BV (bacterial vaginosis)    ED Discharge Orders         Ordered    metroNIDAZOLE (FLAGYL) 500 MG tablet  2 times daily     01/31/18 [address]1424           Renisha Cockrum, Penn FarmsSamantha R, PA-C 01/31/18 1439    Terrilee FilesButler, Michael C, MD 02/01/18 [phone]

## 2018-01-31 NOTE — ED Notes (Signed)
Discharge instructions and prescription discussed with Pt. Pt verbalized understanding. Pt stable and ambulatory.    

## 2018-01-31 NOTE — Discharge Instructions (Addendum)
You were seen in the emergency department and diagnosed with bacterial vaginosis.  We are treating this with Flagyl, an antibiotic, please take this as prescribed.  Please do not consume alcohol with this medicine as it can be severely dangerous.  We have prescribed you new medication(s) today. Discuss the medications prescribed today with your pharmacist as they can have adverse effects and interactions with your other medicines including over the counter and prescribed medications. Seek medical evaluation if you start to experience new or abnormal symptoms after taking one of these medicines, seek care immediately if you start to experience difficulty breathing, feeling of your throat closing, facial swelling, or rash as these could be indications of a more serious allergic reaction  We tested you for gonorrhea, chlamydia, HIV, and syphilis, we will call you if any of these results are positive, if positive you will need to seek treatment and inform all sexual partners.  Follow-up with primary care or with the health department within 1 week.  Return to the ER for new or worsening symptoms or any other concerns.

## 2018-02-01 LAB — HIV ANTIBODY (ROUTINE TESTING W REFLEX): HIV SCREEN 4TH GENERATION: NONREACTIVE

## 2018-02-01 LAB — RPR: RPR: NONREACTIVE

## 2018-02-01 LAB — GC/CHLAMYDIA PROBE AMP (~~LOC~~) NOT AT ARMC
Chlamydia: NEGATIVE
Neisseria Gonorrhea: NEGATIVE

## 2018-03-21 ENCOUNTER — Encounter (HOSPITAL_BASED_OUTPATIENT_CLINIC_OR_DEPARTMENT_OTHER): Payer: Self-pay | Admitting: Emergency Medicine

## 2018-03-21 ENCOUNTER — Other Ambulatory Visit: Payer: Self-pay

## 2018-03-21 ENCOUNTER — Emergency Department (HOSPITAL_BASED_OUTPATIENT_CLINIC_OR_DEPARTMENT_OTHER)
Admission: EM | Admit: 2018-03-21 | Discharge: 2018-03-21 | Disposition: A | Discharge status: Home / Self Care | Payer: Medicaid Other | Attending: Emergency Medicine | Admitting: Emergency Medicine

## 2018-03-21 DIAGNOSIS — R509 Fever, unspecified: Secondary | ICD-10-CM | POA: Diagnosis not present

## 2018-03-21 DIAGNOSIS — J45909 Unspecified asthma, uncomplicated: Secondary | ICD-10-CM | POA: Diagnosis not present

## 2018-03-21 DIAGNOSIS — R070 Pain in throat: Secondary | ICD-10-CM | POA: Insufficient documentation

## 2018-03-21 DIAGNOSIS — R69 Illness, unspecified: Secondary | ICD-10-CM

## 2018-03-21 DIAGNOSIS — J111 Influenza due to unidentified influenza virus with other respiratory manifestations: Secondary | ICD-10-CM | POA: Insufficient documentation

## 2018-03-21 DIAGNOSIS — M791 Myalgia, unspecified site: Secondary | ICD-10-CM | POA: Insufficient documentation

## 2018-03-21 DIAGNOSIS — R05 Cough: Secondary | ICD-10-CM | POA: Diagnosis present

## 2018-03-21 MED ORDER — DEXAMETHASONE 6 MG PO TABS
10.0000 mg | ORAL_TABLET | Freq: Once | ORAL | Status: AC
  Administered 2018-03-21: 10 mg via ORAL
  Filled 2018-03-21: qty 1

## 2018-03-21 MED ORDER — ONDANSETRON HCL 8 MG PO TABS
4.0000 mg | ORAL_TABLET | Freq: Once | ORAL | Status: AC
  Administered 2018-03-21: 4 mg via ORAL
  Filled 2018-03-21: qty 1

## 2018-03-21 MED ORDER — ONDANSETRON HCL 4 MG PO TABS: 4.0000 mg | ORAL_TABLET | Freq: Three times a day (TID) | ORAL | 0 refills | Status: AC | PRN

## 2018-03-21 NOTE — ED Triage Notes (Signed)
Pt having flu like symptoms with generalized body ache, nausea, vomiting and diarrhea for the past 2 days.

## 2018-03-21 NOTE — Discharge Instructions (Signed)
Take 1000 mg of Tylenol up to 4 times a day as needed, take 600 mg of Motrin every 8 hours as needed, stay hydrated, return to the ED if symptoms worsen.

## 2018-03-21 NOTE — ED Provider Notes (Signed)
[facility] EMERGENCY DEPARTMENT Provider Note   CSN: [id number] Arrival date & time: 03/21/18  2210    History   Chief Complaint Chief Complaint  Patient presents with  . Influenza    HPI Lori Randolph is a 23 y.o. female.     The history is provided by the patient.  Influenza  Presenting symptoms: cough, fever, myalgias, nausea and sore throat   Presenting symptoms: no shortness of breath and no vomiting   Severity:  Mild Onset quality:  Gradual Duration:  2 days Progression:  Unchanged Chronicity:  New Relieved by:  Decongestant and OTC medications Worsened by:  Nothing Associated symptoms: chills and nasal congestion   Associated symptoms: no ear pain   Risk factors: sick contacts     Past Medical History:  Diagnosis Date  . Asthma     There are no active problems to display for this patient.   Past Surgical History:  Procedure Laterality Date  . NO PAST SURGERIES       OB History   No obstetric history on file.      Home Medications    Prior to Admission medications   Medication Sig Start Date End Date Taking? Authorizing Provider  albuterol (PROVENTIL HFA;VENTOLIN HFA) 108 (90 Base) MCG/ACT inhaler Inhale into the lungs every 6 (six) hours as needed for wheezing or shortness of breath.    [provider]  amoxicillin-clavulanate (AUGMENTIN) 875-125 MG tablet Take 1 tablet by mouth every 12 (twelve) hours. 10/09/17   Cathie Hoops, Amy V, PA-C  cephALEXin (KEFLEX) 500 MG capsule Take 1 capsule (500 mg total) by mouth 2 (two) times daily. 10/19/17   Maxwell Caul, PA-C  metroNIDAZOLE (FLAGYL) 500 MG tablet Take 1 tablet (500 mg total) by mouth 2 (two) times daily. 01/31/18   Petrucelli, Samantha R, PA-C  mupirocin ointment (BACTROBAN) 2 % Apply 1 application topically 2 (two) times daily. 10/09/17   Cathie Hoops, Amy V, PA-C  ondansetron (ZOFRAN) 4 MG tablet Take 1 tablet (4 mg total) by mouth every 8 (eight) hours as needed for up to 12 days for  nausea or vomiting. 03/21/18 04/02/18  Virgina Norfolk, DO    Family History Family History  Problem Relation Age of Onset  . Healthy Mother     Social History Social History   Tobacco Use  . Smoking status: Never Smoker  Substance Use Topics  . Alcohol use: No  . Drug use: No     Allergies   Ibuprofen   Review of Systems Review of Systems  Constitutional: Positive for chills and fever.  HENT: Positive for congestion and sore throat. Negative for ear pain.   Eyes: Negative for pain and visual disturbance.  Respiratory: Positive for cough. Negative for shortness of breath.   Cardiovascular: Negative for chest pain and palpitations.  Gastrointestinal: Positive for nausea. Negative for abdominal pain and vomiting.  Genitourinary: Negative for dysuria and hematuria.  Musculoskeletal: Positive for myalgias. Negative for arthralgias and back pain.  Skin: Negative for color change and rash.  Neurological: Negative for seizures and syncope.  All other systems reviewed and are negative.    Physical Exam Updated Vital Signs BP 131/65 (BP Location: Right Arm)   Pulse 61   Temp 98.4 F (36.9 C) (Oral)   Resp 18   Ht 5\' 8"  (1.727 m)   Wt 64.7 kg   LMP 03/18/2018   SpO2 100%   BMI 21.68 kg/m   Physical Exam Vitals signs and nursing note reviewed.  Constitutional:      General: She is not in acute distress.    Appearance: She is well-developed.  HENT:     Head: Normocephalic and atraumatic.     Right Ear: Tympanic membrane normal.     Left Ear: Tympanic membrane normal.     Nose: Congestion present.     Mouth/Throat:     Mouth: Mucous membranes are moist.     Pharynx: No oropharyngeal exudate or posterior oropharyngeal erythema.  Eyes:     Extraocular Movements: Extraocular movements intact.     Conjunctiva/sclera: Conjunctivae normal.     Pupils: Pupils are equal, round, and reactive to light.  Neck:     Musculoskeletal: Normal range of motion and neck supple.    Cardiovascular:     Rate and Rhythm: Normal rate and regular rhythm.     Pulses: Normal pulses.     Heart sounds: Normal heart sounds. No murmur.  Pulmonary:     Effort: Pulmonary effort is normal. No respiratory distress.     Breath sounds: Normal breath sounds.  Abdominal:     Palpations: Abdomen is soft.     Tenderness: There is no abdominal tenderness.  Skin:    General: Skin is warm and dry.     Capillary Refill: Capillary refill takes less than 2 seconds.  Neurological:     General: No focal deficit present.     Mental Status: She is alert.      ED Treatments / Results  Labs (all labs ordered are listed, but only abnormal results are displayed) Labs Reviewed - No data to display  EKG None  Radiology No results found.  Procedures Procedures (including critical care time)  Medications Ordered in ED Medications  dexamethasone (DECADRON) tablet 10 mg (has no administration in time range)  ondansetron (ZOFRAN) tablet 4 mg (has no administration in time range)     Initial Impression / Assessment and Plan / ED Course  I have reviewed the triage vital signs and the nursing notes.  Pertinent labs & imaging results that were available during my care of the patient were reviewed by me and considered in my medical decision making (see chart for details).        Lori Randolph is a 23 year old female with no significant medical history who presents to the ED with URI symptoms.  Patient with normal vitals.  No fever.  Patient with cough and congestion and body aches over the last 2 days.  Has had some diarrhea and nausea but no vomiting.  Patient with sick contacts.  Patient has some nasal congestion on exam but otherwise no signs of ear or throat infection.  Clear breath sounds bilaterally.  Reassuring vitals.  Likely ongoing viral process.  Patient states she needs a work note as she works third shift.  She has taken Tylenol and Motrin today with improvement as well.   She is able to hold down fluids.  Will treat with Decadron and Zofran.  Patient given prescription for Zofran.  At this time low concern for pneumonia.  She does not have any abdominal tenderness.  Recommend increase hydration, Motrin, Tylenol at home.  Given return precautions.  Recommend follow-up with primary care doctor if symptoms persist.  This chart was dictated using voice recognition software.  Despite best efforts to proofread,  errors can occur which can change the documentation meaning.    Final Clinical Impressions(s) / ED Diagnoses   Final diagnoses:  Influenza-like illness    ED Discharge  Orders         Ordered    ondansetron (ZOFRAN) 4 MG tablet  Every 8 hours PRN     03/21/18 2222           Virgina Norfolk, DO 03/21/18 2227

## 2019-01-30 IMAGING — DX DG CHEST 2V
2 series · 2 of 2 positions shown · non-contrast
Comparison: None.

CLINICAL DATA: Cough

EXAM:
CHEST - 2 VIEW

[chest pa]
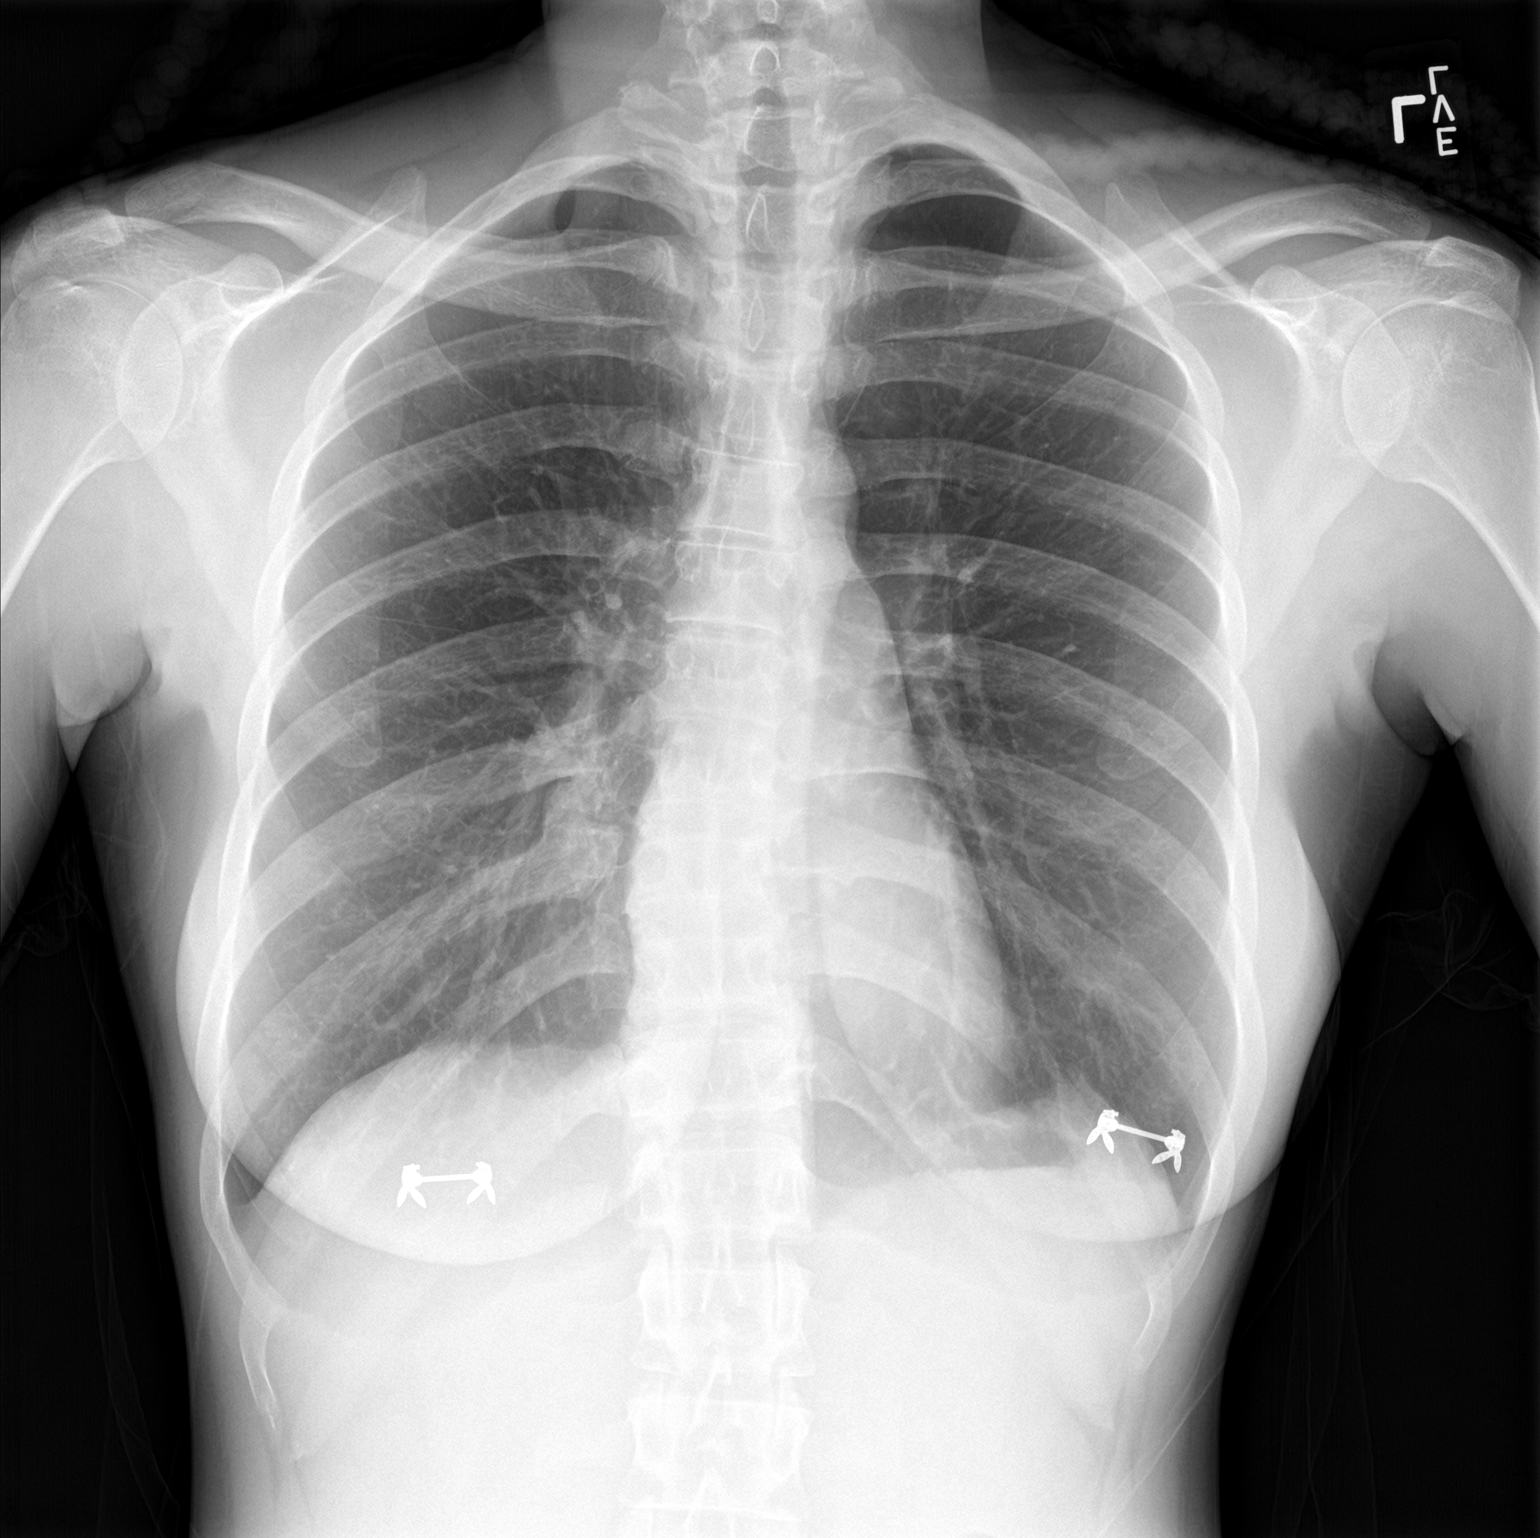

[chest lat]
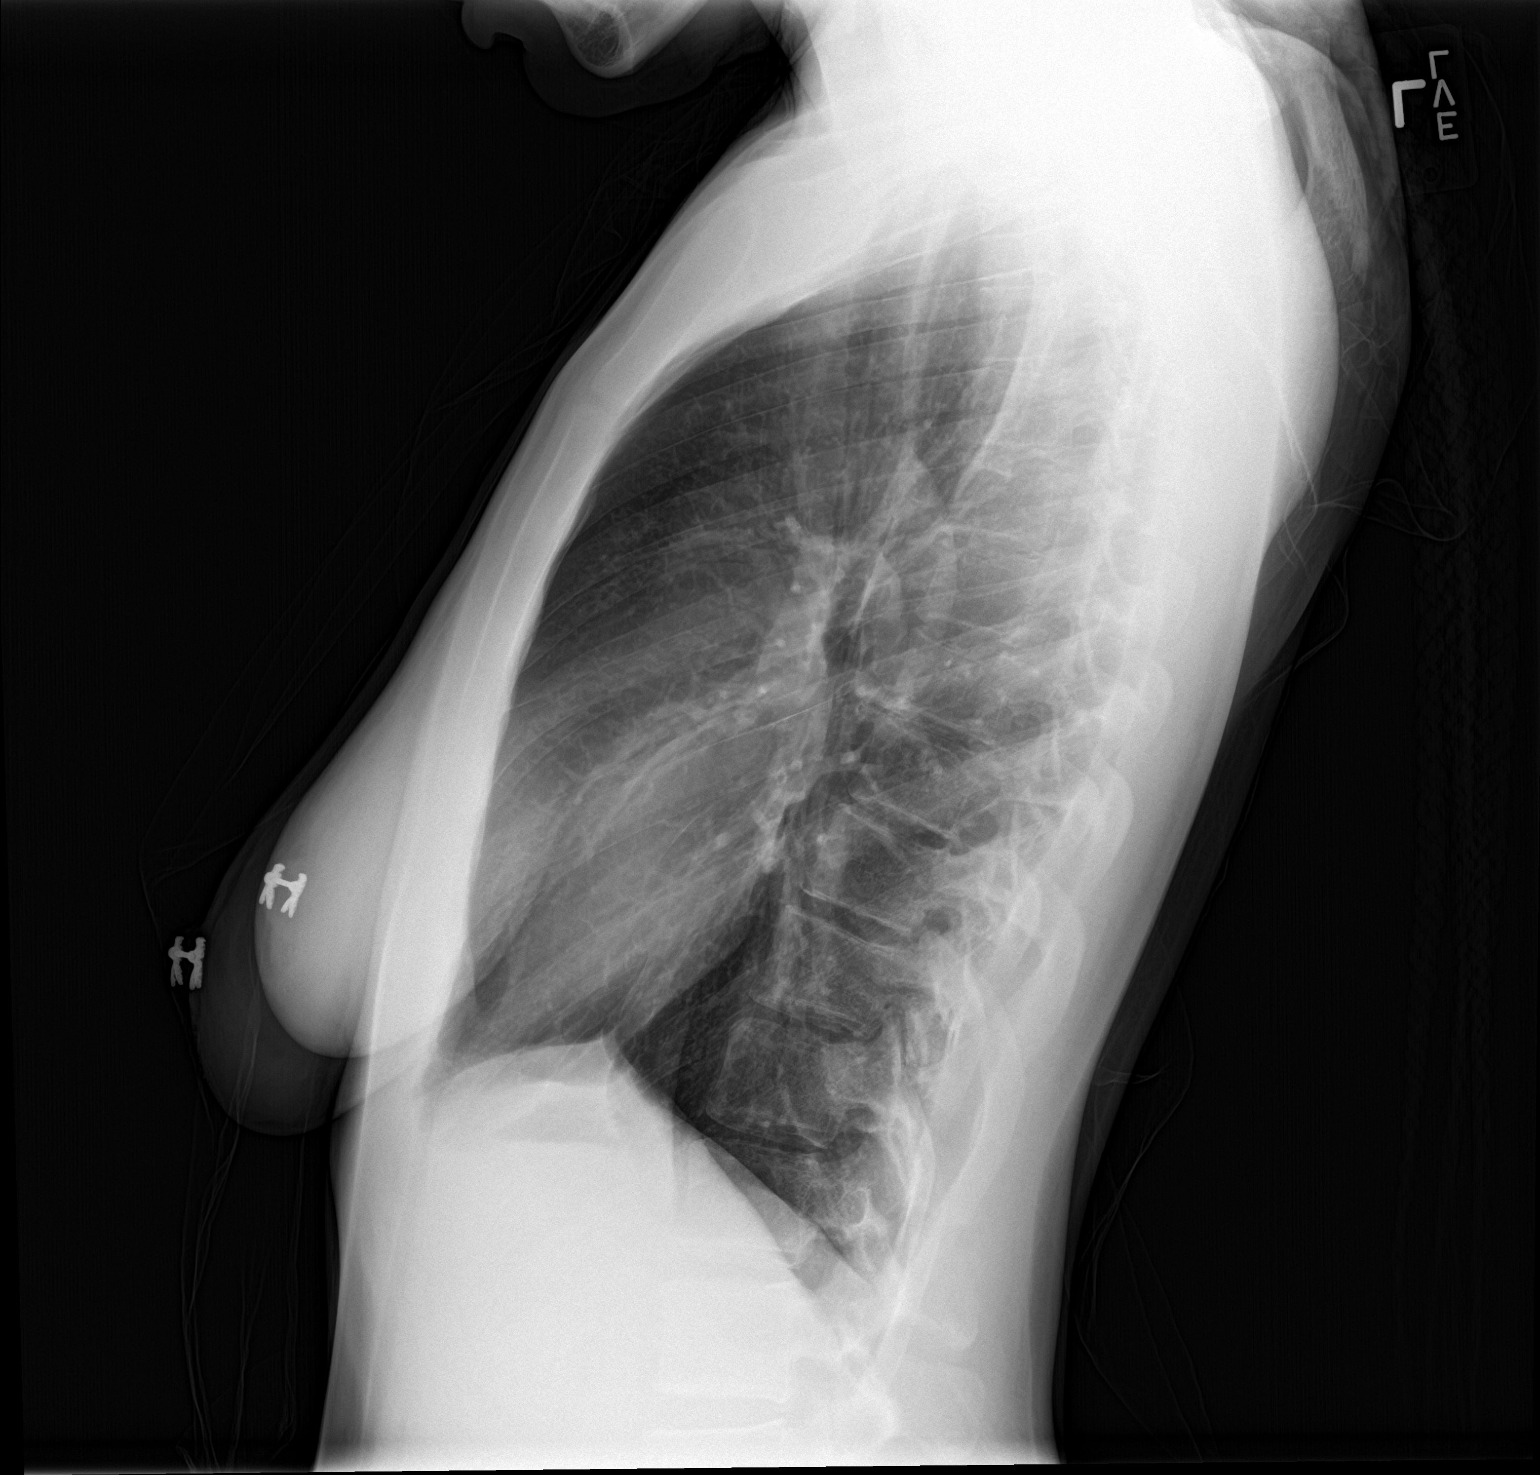

[2 of 2 positions shown; findings below may reference images not displayed]

FINDINGS: The heart size and mediastinal contours are within normal limits.
Both lungs are clear. The visualized skeletal structures are
unremarkable.
IMPRESSION: No active cardiopulmonary disease.

## 2019-07-28 ENCOUNTER — Emergency Department (HOSPITAL_BASED_OUTPATIENT_CLINIC_OR_DEPARTMENT_OTHER)
Admission: EM | Admit: 2019-07-28 | Discharge: 2019-07-28 | Disposition: A | Discharge status: Home / Self Care | Payer: Medicaid Other | Attending: Emergency Medicine | Admitting: Emergency Medicine

## 2019-07-28 ENCOUNTER — Encounter (HOSPITAL_BASED_OUTPATIENT_CLINIC_OR_DEPARTMENT_OTHER): Payer: Self-pay | Admitting: Emergency Medicine

## 2019-07-28 ENCOUNTER — Other Ambulatory Visit: Payer: Self-pay

## 2019-07-28 DIAGNOSIS — M545 Low back pain: Secondary | ICD-10-CM | POA: Insufficient documentation

## 2019-07-28 DIAGNOSIS — Z886 Allergy status to analgesic agent status: Secondary | ICD-10-CM | POA: Diagnosis not present

## 2019-07-28 DIAGNOSIS — S39012A Strain of muscle, fascia and tendon of lower back, initial encounter: Secondary | ICD-10-CM

## 2019-07-28 DIAGNOSIS — J45909 Unspecified asthma, uncomplicated: Secondary | ICD-10-CM | POA: Diagnosis not present

## 2019-07-28 MED ORDER — TRAMADOL HCL 50 MG PO TABS: 50.0000 mg | ORAL_TABLET | Freq: Four times a day (QID) | ORAL | 0 refills | Status: AC | PRN

## 2019-07-28 MED ORDER — PREDNISONE 10 MG PO TABS: 20.0000 mg | ORAL_TABLET | Freq: Two times a day (BID) | ORAL | 0 refills | Status: AC

## 2019-07-28 MED FILL — predniSONE 10 MG TABS: 10 | 5 days supply | Qty: 20 | Fill #0

## 2019-07-28 NOTE — Discharge Instructions (Signed)
Begin taking prednisone as prescribed.  Begin taking tramadol as prescribed as needed for pain.  Follow-up with your primary doctor if symptoms or not improving in the next week, and return to the ER if symptoms significantly worsen or change.

## 2019-07-28 NOTE — ED Triage Notes (Signed)
Pt sts she moved yesterday and believes she lifted something wrong.  Pain across low back.  Pain radiates down into left leg. Reports hx of back pain since she had an epidural.

## 2019-07-28 NOTE — ED Provider Notes (Signed)
[place] EMERGENCY DEPARTMENT Provider Note   CSN: [id number] Arrival date & time: 07/28/19  1517     History Chief Complaint  Patient presents with  . Back Pain    Lori Randolph is a 24 y.o. female.  Patient is a 24 year old female with history of asthma.  She presents today for evaluation of low back pain.  Patient states that she is moving to a new house and has been moving furniture and boxes for the past several days.  She is now having discomfort in her low back with radiation of her pain into her left leg.  She denies any bowel or bladder complaints.  She denies any weakness or numbness.  Her pain is worse when she bends and moves.  It is relieved with rest.  The history is provided by the patient.       Past Medical History:  Diagnosis Date  . Asthma     There are no problems to display for this patient.   Past Surgical History:  Procedure Laterality Date  . NO PAST SURGERIES       OB History   No obstetric history on file.     Family History  Problem Relation Age of Onset  . Healthy Mother     Social History   Tobacco Use  . Smoking status: Never Smoker  . Smokeless tobacco: Never Used  Substance Use Topics  . Alcohol use: No  . Drug use: No    Home Medications Prior to Admission medications   Medication Sig Start Date End Date Taking? Authorizing Provider  albuterol (PROVENTIL HFA;VENTOLIN HFA) 108 (90 Base) MCG/ACT inhaler Inhale into the lungs every 6 (six) hours as needed for wheezing or shortness of breath.    [provider]    Allergies    Ibuprofen  Review of Systems   Review of Systems  All other systems reviewed and are negative.   Physical Exam Updated Vital Signs BP 115/77 (BP Location: Right Arm)   Pulse 70   Temp 98.1 F (36.7 C) (Oral)   Resp 16   Ht 5\' 8"  (1.727 m)   Wt 59.7 kg   LMP 07/14/2019   SpO2 100%   BMI 20.02 kg/m   Physical Exam Vitals and nursing note reviewed.    Constitutional:      General: She is not in acute distress.    Appearance: Normal appearance. She is not ill-appearing.  HENT:     Head: Normocephalic and atraumatic.  Pulmonary:     Effort: Pulmonary effort is normal.  Musculoskeletal:     Comments: There is tenderness to palpation in the soft tissues of the lumbar region.  There is no bony tenderness or step-off.  Skin:    General: Skin is warm and dry.  Neurological:     Mental Status: She is alert and oriented to person, place, and time.     Comments: DTRs are trace and symmetrical in the patellar and Achilles tendons bilaterally.  Strength is 5 out of 5 in both lower extremities.  She is able to ambulate on her heels and toes without difficulty.     ED Results / Procedures / Treatments   Labs (all labs ordered are listed, but only abnormal results are displayed) Labs Reviewed - No data to display  EKG None  Radiology No results found.  Procedures Procedures (including critical care time)  Medications Ordered in ED Medications - No data to display  ED Course  I  have reviewed the triage vital signs and the nursing notes.  Pertinent labs & imaging results that were available during my care of the patient were reviewed by me and considered in my medical decision making (see chart for details).    MDM Rules/Calculators/A&P  Patient presenting with low back pain after moving heavy objects that appears musculoskeletal in nature.  It is worse when she moves and bends and relieved with rest.  There are no red flags that would suggest an emergent situation.  Reflexes and strength are symmetrical and there are no bowel or bladder issues.  I feel as though a course of anti-inflammatories and muscle relaxers is appropriate.  If she is not improving in the next 1 to 2 weeks, she should follow-up with her primary doctor to be rechecked.  Final Clinical Impression(s) / ED Diagnoses Final diagnoses:  None    Rx / DC  Orders ED Discharge Orders    None       Geoffery Lyons, MD 07/28/19 [id number]

## 2019-09-11 ENCOUNTER — Emergency Department (HOSPITAL_BASED_OUTPATIENT_CLINIC_OR_DEPARTMENT_OTHER): Payer: Medicaid Other

## 2019-09-11 ENCOUNTER — Emergency Department (HOSPITAL_BASED_OUTPATIENT_CLINIC_OR_DEPARTMENT_OTHER)
Admission: EM | Admit: 2019-09-11 | Discharge: 2019-09-11 | Disposition: A | Discharge status: Home / Self Care | Payer: Medicaid Other | Attending: Emergency Medicine | Admitting: Emergency Medicine

## 2019-09-11 ENCOUNTER — Encounter (HOSPITAL_BASED_OUTPATIENT_CLINIC_OR_DEPARTMENT_OTHER): Payer: Self-pay

## 2019-09-11 ENCOUNTER — Other Ambulatory Visit: Payer: Self-pay

## 2019-09-11 DIAGNOSIS — J209 Acute bronchitis, unspecified: Secondary | ICD-10-CM | POA: Diagnosis not present

## 2019-09-11 DIAGNOSIS — Z79899 Other long term (current) drug therapy: Secondary | ICD-10-CM | POA: Diagnosis not present

## 2019-09-11 DIAGNOSIS — J45909 Unspecified asthma, uncomplicated: Secondary | ICD-10-CM | POA: Diagnosis not present

## 2019-09-11 DIAGNOSIS — R05 Cough: Secondary | ICD-10-CM | POA: Diagnosis present

## 2019-09-11 DIAGNOSIS — J40 Bronchitis, not specified as acute or chronic: Secondary | ICD-10-CM

## 2019-09-11 DIAGNOSIS — Z20822 Contact with and (suspected) exposure to covid-19: Secondary | ICD-10-CM | POA: Diagnosis not present

## 2019-09-11 LAB — SARS CORONAVIRUS 2 BY RT PCR (HOSPITAL ORDER, PERFORMED IN ~~LOC~~ HOSPITAL LAB): SARS Coronavirus 2: NEGATIVE

## 2019-09-11 NOTE — Discharge Instructions (Addendum)
1.  Your Covid test has returned negative. 2.  Your symptoms appear likely due to a viral bronchitis or asthma exacerbation.  Continue to use your albuterol inhaler as needed.  May take over-the-counter Tylenol if needed for body aches or fever. 3.  Make an appointment to see your doctor for recheck as soon as possible.

## 2019-09-11 NOTE — ED Triage Notes (Signed)
Pt arrives with c/o SOB, nausea, muscle pain X2 days, denies fever, denies contact with anyone with Covid.

## 2019-09-11 NOTE — ED Provider Notes (Signed)
[facility] EMERGENCY DEPARTMENT Provider Note   CSN: [id number] Arrival date & time: 09/11/19  0920     History Chief Complaint  Patient presents with  . COVID SYMPTOMS    Angell Pincock is a 24 y.o. female.  HPI Patient reports she developed some achiness and cough starting yesterday.  She has not had any documented fever.  She has not had contact with anyone that is Covid as far she knows.  She reports she has had some nausea and felt short of breath last night.  Patient does have history of asthma.  She reports she has been using her inhaler.  At baseline, her asthma is pretty well controlled and only requires as needed albuterol.  Patient reports that she has to have a Covid test done before she can go back to work.    Past Medical History:  Diagnosis Date  . Asthma     There are no problems to display for this patient.   Past Surgical History:  Procedure Laterality Date  . NO PAST SURGERIES       OB History   No obstetric history on file.     Family History  Problem Relation Age of Onset  . Healthy Mother     Social History   Tobacco Use  . Smoking status: Never Smoker  . Smokeless tobacco: Never Used  Substance Use Topics  . Alcohol use: No  . Drug use: No    Home Medications Prior to Admission medications   Medication Sig Start Date End Date Taking? Authorizing Provider  albuterol (PROVENTIL HFA;VENTOLIN HFA) 108 (90 Base) MCG/ACT inhaler Inhale into the lungs every 6 (six) hours as needed for wheezing or shortness of breath.    [provider]  predniSONE (DELTASONE) 10 MG tablet Take 2 tablets (20 mg total) by mouth 2 (two) times daily. 07/28/19   Geoffery Lyons, MD  traMADol (ULTRAM) 50 MG tablet Take 1 tablet (50 mg total) by mouth every 6 (six) hours as needed. 07/28/19   Geoffery Lyons, MD    Allergies    Ibuprofen  Review of Systems   Review of Systems 10 systems reviewed and negative except as per HPI Physical  Exam Updated Vital Signs BP 95/75 (BP Location: Right Arm)   Pulse 65   Temp 98.3 F (36.8 C) (Oral)   Resp 14   Ht 5\' 8"  (1.727 m)   Wt 61.2 kg   LMP 08/25/2019   SpO2 100%   BMI 20.53 kg/m   Physical Exam Constitutional:      Appearance: She is well-developed.  HENT:     Head: Normocephalic and atraumatic.     Mouth/Throat:     Mouth: Mucous membranes are moist.     Pharynx: Oropharynx is clear.  Eyes:     Extraocular Movements: Extraocular movements intact.     Conjunctiva/sclera: Conjunctivae normal.  Cardiovascular:     Rate and Rhythm: Normal rate and regular rhythm.     Heart sounds: Normal heart sounds.  Pulmonary:     Effort: Pulmonary effort is normal.     Breath sounds: Normal breath sounds.  Abdominal:     General: Bowel sounds are normal. There is no distension.     Palpations: Abdomen is soft.     Tenderness: There is no abdominal tenderness.  Musculoskeletal:        General: Normal range of motion.     Cervical back: Neck supple.  Skin:    General: Skin is  warm and dry.  Neurological:     Mental Status: She is alert and oriented to person, place, and time.     GCS: GCS eye subscore is 4. GCS verbal subscore is 5. GCS motor subscore is 6.     Coordination: Coordination normal.     ED Results / Procedures / Treatments   Labs (all labs ordered are listed, but only abnormal results are displayed) Labs Reviewed  SARS CORONAVIRUS 2 BY RT PCR (HOSPITAL ORDER, PERFORMED IN [facility])    EKG None  Radiology DG Chest Port 1 View  Result Date: 09/11/2019 CLINICAL DATA:  Cough, shortness of breath, nausea, muscle pain for 2 days, denies contact with COVID-19 EXAM: PORTABLE CHEST 1 VIEW COMPARISON:  Portable exam 1113 hours compared to 10/18/2017 FINDINGS: Normal heart size, mediastinal contours, and pulmonary vascularity. Mild peribronchial thickening. No pulmonary infiltrate, pleural effusion, or pneumothorax. Dextroconvex thoracic  scoliosis. IMPRESSION: Mild peribronchial thickening which could reflect bronchitis or asthma. No acute infiltrate. Electronically Signed   By: Ulyses Southward M.D.   On: 09/11/2019 11:28    Procedures Procedures (including critical care time)  Medications Ordered in ED Medications - No data to display  ED Course  I have reviewed the triage vital signs and the nursing notes.  Pertinent labs & imaging results that were available during my care of the patient were reviewed by me and considered in my medical decision making (see chart for details).    MDM Rules/Calculators/A&P                          Patient reports some symptoms of body aches and cough.  She has concern for possible Covid.  Reports she has to have a test done before she can go back to work.  Has not had any documented fever.  Patient does have a history of asthma.  On examination there is no active wheezing.  Chest x-ray shows some mild bronchitic changes but no pattern suggestive of patchy pneumonia.  Covid testing is negative.  Nicha Hemann was evaluated in Emergency Department on 09/11/2019 for the symptoms described in the history of present illness. She was evaluated in the context of the global COVID-19 pandemic, which necessitated consideration that the patient might be at risk for infection with the SARS-CoV-2 virus that causes COVID-19. Institutional protocols and algorithms that pertain to the evaluation of patients at risk for COVID-19 are in a state of rapid change based on information released by regulatory bodies including the CDC and federal and state organizations. These policies and algorithms were followed during the patient's care in the ED. Final Clinical Impression(s) / ED Diagnoses Final diagnoses:  Bronchitis    Rx / DC Orders ED Discharge Orders    None       Arby Barrette, MD 09/11/19 1252

## 2019-09-11 NOTE — ED Notes (Signed)
Nausea , sob and  Chills since yesterday

## 2019-10-07 ENCOUNTER — Emergency Department (HOSPITAL_BASED_OUTPATIENT_CLINIC_OR_DEPARTMENT_OTHER)
Admission: EM | Admit: 2019-10-07 | Discharge: 2019-10-07 | Disposition: A | Discharge status: Home / Self Care | Payer: Medicaid Other | Attending: Emergency Medicine | Admitting: Emergency Medicine

## 2019-10-07 ENCOUNTER — Other Ambulatory Visit: Payer: Self-pay

## 2019-10-07 ENCOUNTER — Encounter (HOSPITAL_BASED_OUTPATIENT_CLINIC_OR_DEPARTMENT_OTHER): Payer: Self-pay

## 2019-10-07 DIAGNOSIS — R112 Nausea with vomiting, unspecified: Secondary | ICD-10-CM | POA: Diagnosis not present

## 2019-10-07 DIAGNOSIS — Z5321 Procedure and treatment not carried out due to patient leaving prior to being seen by health care provider: Secondary | ICD-10-CM | POA: Diagnosis not present

## 2019-10-07 DIAGNOSIS — R519 Headache, unspecified: Secondary | ICD-10-CM | POA: Insufficient documentation

## 2019-10-07 DIAGNOSIS — R109 Unspecified abdominal pain: Secondary | ICD-10-CM | POA: Diagnosis present

## 2019-10-07 LAB — COMPREHENSIVE METABOLIC PANEL
ALT: 13 U/L (ref 0–44)
AST: 20 U/L (ref 15–41)
Albumin: 4 g/dL (ref 3.5–5.0)
Alkaline Phosphatase: 75 U/L (ref 38–126)
Anion gap: 9 (ref 5–15)
BUN: 14 mg/dL (ref 6–20)
CO2: 24 mmol/L (ref 22–32)
Calcium: 9.1 mg/dL (ref 8.9–10.3)
Chloride: 101 mmol/L (ref 98–111)
Creatinine, Ser: 0.71 mg/dL (ref 0.44–1.00)
GFR calc Af Amer: >60 mL/min (ref >60)
GFR calc non Af Amer: >60 mL/min (ref >60)
Glucose, Bld: 67 mg/dL — ABNORMAL LOW (ref 70–99)
Potassium: 3.5 mmol/L (ref 3.5–5.1)
Sodium: 134 mmol/L — ABNORMAL LOW (ref 135–145)
Total Bilirubin: 0.1 mg/dL — ABNORMAL LOW (ref 0.3–1.2)
Total Protein: 7.3 g/dL (ref 6.5–8.1)

## 2019-10-07 LAB — CBC
HCT: 39.7 % (ref 36.0–46.0)
Hemoglobin: 13 g/dL (ref 12.0–15.0)
MCH: 31.5 pg (ref 26.0–34.0)
MCHC: 32.7 g/dL (ref 30.0–36.0)
MCV: 96.1 fL (ref 80.0–100.0)
Platelets: 239 10*3/uL (ref 150–400)
RBC: 4.13 MIL/uL (ref 3.87–5.11)
RDW: 12.9 % (ref 11.5–15.5)
WBC: 7.5 10*3/uL (ref 4.0–10.5)
nRBC: 0 % (ref 0.0–0.2)

## 2019-10-07 LAB — URINALYSIS, ROUTINE W REFLEX MICROSCOPIC
Bilirubin Urine: NEGATIVE
Glucose, UA: NEGATIVE mg/dL
Hgb urine dipstick: NEGATIVE
Ketones, ur: NEGATIVE mg/dL
Leukocytes,Ua: NEGATIVE
Nitrite: NEGATIVE
Protein, ur: NEGATIVE mg/dL
Specific Gravity, Urine: >1.03 — ABNORMAL HIGH (ref 1.005–1.030)
pH: 6 (ref 5.0–8.0)

## 2019-10-07 LAB — LIPASE, BLOOD: Lipase: 23 U/L (ref 11–51)

## 2019-10-07 LAB — PREGNANCY, URINE: Preg Test, Ur: NEGATIVE

## 2019-10-07 NOTE — ED Notes (Signed)
Called for vitals recheck multiple times, security reports seeing pt leave this campus.

## 2019-10-07 NOTE — ED Notes (Addendum)
Called to lobby to speak to patient. Pt was speaking with charge nurse and when charge nurse told patient that she could not have a doctors note until she saw the provider, she demanded to speak with a supervisor and started cursing charge nurse.  When this RN went to lobby to speak to patient, patient yelling and cursing at me and would not let me answer her questions. Patient kept interrupting , and cursing me. I stated to patient that we were very busy and would get to her as soon as possible. Pt demanded to know how much longer she would be here. Pt kept repeating that she had been here waiting for 2 and a half hours. When I told her she had only been here for 1 and a half hours and for her to stop using that kind of language in the lobby , she walked down the hall to sit in hallway while talking on the phone. [employer] officer and Engineer, materials standing by, witnessing the event.

## 2019-10-07 NOTE — ED Triage Notes (Signed)
Pt arrives with c/o abdominal pains and headache starting this morning, Reports some NV, denies diarrhea.

## 2021-04-11 ENCOUNTER — Encounter: Payer: Self-pay | Admitting: Obstetrics & Gynecology

## 2021-04-11 ENCOUNTER — Other Ambulatory Visit (HOSPITAL_COMMUNITY)
Admission: RE | Admit: 2021-04-11 | Discharge: 2021-04-11 | Disposition: A | Discharge status: Home / Self Care | Payer: Medicaid Other | Source: Ambulatory Visit | Attending: Obstetrics & Gynecology | Admitting: Obstetrics & Gynecology

## 2021-04-11 ENCOUNTER — Other Ambulatory Visit: Payer: Self-pay

## 2021-04-11 ENCOUNTER — Ambulatory Visit: Payer: Medicaid Other | Admitting: Obstetrics & Gynecology

## 2021-04-11 VITALS — BP 101/63 | HR 84 | Ht 68.0 in | Wt 129.0 lb

## 2021-04-11 DIAGNOSIS — Z01419 Encounter for gynecological examination (general) (routine) without abnormal findings: Secondary | ICD-10-CM

## 2021-04-11 DIAGNOSIS — Z113 Encounter for screening for infections with a predominantly sexual mode of transmission: Secondary | ICD-10-CM | POA: Diagnosis not present

## 2021-04-11 NOTE — Progress Notes (Signed)
? ? ?GYNECOLOGY ANNUAL PREVENTATIVE CARE ENCOUNTER NOTE ? ?History:    ? Samaya Boardley is a 26 y.o. [id number] female here for a routine annual gynecologic exam and to establish care.  Current complaints: none.   Denies abnormal vaginal bleeding, discharge, pelvic pain, problems with intercourse or other gynecologic concerns.  She does report having two periods per month (separated by about one - two weeks), but this her been her pattern since birth of her son four years ago and she does not want intervention or evaluation for this at this point. She does desire annual labs for preventative health maintenance and STI screen.  ?  ?Gynecologic History ?Patient's last menstrual period was 04/04/2021. ?Contraception: none, currently married to same-sex partner ?Last Pap: 10/16/2019. Result was normal.  ? ?Obstetric History ?OB History  ?Gravida Para Term Preterm AB Living  ?1 1 1     1   ?SAB IAB Ectopic Multiple Live Births  ?        1  ?  ?# Outcome Date GA Lbr Len/2nd Weight Sex Delivery Anes PTL Lv  ?1 Term 2018 [redacted]w[redacted]d   M Vag-Spont EPI N LIV  ? ? ?Past Medical History:  ?Diagnosis Date  ? Asthma   ? ? ?Past Surgical History:  ?Procedure Laterality Date  ? NO PAST SURGERIES    ? ? ?Current Outpatient Medications on File Prior to Visit  ?Medication Sig Dispense Refill  ? albuterol (PROVENTIL HFA;VENTOLIN HFA) 108 (90 Base) MCG/ACT inhaler Inhale into the lungs every 6 (six) hours as needed for wheezing or shortness of breath.    ? predniSONE (DELTASONE) 10 MG tablet Take 2 tablets (20 mg total) by mouth 2 (two) times daily. (Patient not taking: Reported on 04/11/2021) 20 tablet 0  ? traMADol (ULTRAM) 50 MG tablet Take 1 tablet (50 mg total) by mouth every 6 (six) hours as needed. (Patient not taking: Reported on 04/11/2021) 15 tablet 0  ? ?No current facility-administered medications on file prior to visit.  ? ? ?Allergies  ?Allergen Reactions  ? Ibuprofen Swelling  ?  Facial swelling ?Facial swelling ?Facial  swelling ?Facial swelling ?  ? ? ?Social History:  reports that she has never smoked. She has never used smokeless tobacco. She reports that she does not drink alcohol and does not use drugs. ? ?Family History  ?Problem Relation Age of Onset  ? Healthy Mother   ? ? ?The following portions of the patient's history were reviewed and updated as appropriate: allergies, current medications, past family history, past medical history, past social history, past surgical history and problem list. ? ?Review of Systems ?Pertinent items noted in HPI and remainder of comprehensive ROS otherwise negative. ? ?Physical Exam:  ?BP 101/63   Pulse 84   Ht 5\' 8"  (1.727 m)   Wt 129 lb (58.5 kg)   LMP 04/04/2021   BMI 19.61 kg/m?  ?CONSTITUTIONAL: Well-developed, well-nourished female in no acute distress.  ?HENT:  Normocephalic, atraumatic, External right and left ear normal.  ?EYES: Conjunctivae and EOM are normal. Pupils are equal, round, and reactive to light. No scleral icterus.  ?NECK: Normal range of motion, supple, no masses.  Normal thyroid.  ?SKIN: Skin is warm and dry. No rash noted. Not diaphoretic. No erythema. No pallor. Multiple tattoos.  ?MUSCULOSKELETAL: Normal range of motion. No tenderness.  No cyanosis, clubbing, or edema. ?NEUROLOGIC: Alert and oriented to person, place, and time. Normal reflexes, muscle tone coordination.  ?PSYCHIATRIC: Normal mood and affect. Normal behavior. Normal judgment  and thought content. ?CARDIOVASCULAR: Normal heart rate noted, regular rhythm ?RESPIRATORY: Clear to auscultation bilaterally. Effort and breath sounds normal, no problems with respiration noted. ?BREASTS: Symmetric in size. No masses, tenderness, skin changes, nipple drainage, or lymphadenopathy bilaterally. Performed in the presence of a chaperone. ?ABDOMEN: Soft, no distention noted.  No tenderness, rebound or guarding.  ?PELVIC: Normal appearing external genitalia and urethral meatus; normal appearing distal vaginal  mucosa.  No abnormal vaginal discharge noted, testing sample obtained.  Bimanual and speculum exam deferred.  Performed in the presence of a chaperone. ?  ?Assessment and Plan:  ?  1. Well woman exam with routine gynecological exam ?Pap is up to date. Patient desires preventative healthcare maintenance labs. ?- CBC ?- Comprehensive metabolic panel ?- Lipid panel ?- Hemoglobin A1c ?- TSH ? ?2. Routine screening for STI (sexually transmitted infection) ?Yearly STI screen done ?- RPR+HBsAg+HCVAb+HIV ?- Cervicovaginal ancillary only ? ?Other routine preventative health maintenance measures emphasized. ?Please refer to After Visit Summary for other counseling recommendations.  ?   ? ?Jaynie Collins, MD, FACOG ?Obstetrician Heritage manager, Faculty Practice ?Center for [employer], [facility] ? ?

## 2021-04-12 LAB — CERVICOVAGINAL ANCILLARY ONLY
Chlamydia: NEGATIVE
Comment: NEGATIVE
Comment: NEGATIVE
Comment: NORMAL
Neisseria Gonorrhea: NEGATIVE
Trichomonas: NEGATIVE

## 2021-04-12 LAB — LIPID PANEL
Chol/HDL Ratio: 2.1 ratio (ref 0.0–4.4)
Cholesterol, Total: 144 mg/dL (ref 100–199)
HDL: 67 mg/dL (ref >39)
LDL Chol Calc (NIH): 66 mg/dL (ref 0–99)
Triglycerides: 48 mg/dL (ref 0–149)
VLDL Cholesterol Cal: 11 mg/dL (ref 5–40)

## 2021-04-12 LAB — COMPREHENSIVE METABOLIC PANEL
ALT: 11 IU/L (ref 0–32)
AST: 18 IU/L (ref 0–40)
Albumin/Globulin Ratio: 1.6 (ref 1.2–2.2)
Albumin: 4.6 g/dL (ref 3.9–5.0)
Alkaline Phosphatase: 100 IU/L (ref 44–121)
BUN/Creatinine Ratio: 16 (ref 9–23)
BUN: 13 mg/dL (ref 6–20)
Bilirubin Total: 0.4 mg/dL (ref 0.0–1.2)
CO2: 21 mmol/L (ref 20–29)
Calcium: 9.6 mg/dL (ref 8.7–10.2)
Chloride: 105 mmol/L (ref 96–106)
Creatinine, Ser: 0.8 mg/dL (ref 0.57–1.00)
Globulin, Total: 2.8 g/dL (ref 1.5–4.5)
Glucose: 79 mg/dL (ref 70–99)
Potassium: 4.1 mmol/L (ref 3.5–5.2)
Sodium: 141 mmol/L (ref 134–144)
Total Protein: 7.4 g/dL (ref 6.0–8.5)
eGFR: 104 mL/min/{1.73_m2} (ref >59)

## 2021-04-12 LAB — CBC
Hematocrit: 40.9 % (ref 34.0–46.6)
Hemoglobin: 13.6 g/dL (ref 11.1–15.9)
MCH: 31.3 pg (ref 26.6–33.0)
MCHC: 33.3 g/dL (ref 31.5–35.7)
MCV: 94 fL (ref 79–97)
Platelets: 288 10*3/uL (ref 150–450)
RBC: 4.35 x10E6/uL (ref 3.77–5.28)
RDW: 12.1 % (ref 11.7–15.4)
WBC: 10 10*3/uL (ref 3.4–10.8)

## 2021-04-12 LAB — RPR+HBSAG+HCVAB+...
HIV Screen 4th Generation wRfx: NONREACTIVE
Hep C Virus Ab: NONREACTIVE
Hepatitis B Surface Ag: NEGATIVE
RPR Ser Ql: NONREACTIVE

## 2021-04-12 LAB — HEMOGLOBIN A1C
Est. average glucose Bld gHb Est-mCnc: 111 mg/dL
Hgb A1c MFr Bld: 5.5 % (ref 4.8–5.6)

## 2021-04-12 LAB — TSH: TSH: 0.579 u[IU]/mL (ref 0.450–4.500)

## 2022-05-23 ENCOUNTER — Ambulatory Visit: Payer: Medicaid Other

## 2022-12-13 ENCOUNTER — Other Ambulatory Visit: Payer: Self-pay

## 2022-12-13 ENCOUNTER — Emergency Department (HOSPITAL_BASED_OUTPATIENT_CLINIC_OR_DEPARTMENT_OTHER)
Admission: EM | Admit: 2022-12-13 | Discharge: 2022-12-13 | Disposition: A | Discharge status: Home / Self Care | Payer: Medicaid Other | Attending: Emergency Medicine | Admitting: Emergency Medicine

## 2022-12-13 ENCOUNTER — Emergency Department (HOSPITAL_BASED_OUTPATIENT_CLINIC_OR_DEPARTMENT_OTHER): Payer: Medicaid Other

## 2022-12-13 ENCOUNTER — Encounter (HOSPITAL_BASED_OUTPATIENT_CLINIC_OR_DEPARTMENT_OTHER): Payer: Self-pay | Admitting: Emergency Medicine

## 2022-12-13 DIAGNOSIS — G43909 Migraine, unspecified, not intractable, without status migrainosus: Secondary | ICD-10-CM | POA: Diagnosis not present

## 2022-12-13 DIAGNOSIS — G43809 Other migraine, not intractable, without status migrainosus: Secondary | ICD-10-CM

## 2022-12-13 DIAGNOSIS — R519 Headache, unspecified: Secondary | ICD-10-CM | POA: Diagnosis present

## 2022-12-13 DIAGNOSIS — R202 Paresthesia of skin: Secondary | ICD-10-CM | POA: Diagnosis not present

## 2022-12-13 LAB — CBC
HCT: 43.6 % (ref 36.0–46.0)
Hemoglobin: 14.5 g/dL (ref 12.0–15.0)
MCH: 33 pg (ref 26.0–34.0)
MCHC: 33.3 g/dL (ref 30.0–36.0)
MCV: 99.3 fL (ref 80.0–100.0)
Platelets: 244 10*3/uL (ref 150–400)
RBC: 4.39 MIL/uL (ref 3.87–5.11)
RDW: 13 % (ref 11.5–15.5)
WBC: 9.6 10*3/uL (ref 4.0–10.5)
nRBC: 0 % (ref 0.0–0.2)

## 2022-12-13 LAB — MAGNESIUM: Magnesium: 2 mg/dL (ref 1.7–2.4)

## 2022-12-13 LAB — BASIC METABOLIC PANEL
Anion gap: 8 (ref 5–15)
BUN: 13 mg/dL (ref 6–20)
CO2: 25 mmol/L (ref 22–32)
Calcium: 8.9 mg/dL (ref 8.9–10.3)
Chloride: 104 mmol/L (ref 98–111)
Creatinine, Ser: 0.82 mg/dL (ref 0.44–1.00)
GFR, Estimated: >60 mL/min (ref >60)
Glucose, Bld: 85 mg/dL (ref 70–99)
Potassium: 3.9 mmol/L (ref 3.5–5.1)
Sodium: 137 mmol/L (ref 135–145)

## 2022-12-13 LAB — HCG, SERUM, QUALITATIVE: Preg, Serum: NEGATIVE

## 2022-12-13 MED ORDER — DEXAMETHASONE SODIUM PHOSPHATE 10 MG/ML IJ SOLN
6.0000 mg | Freq: Once | INTRAMUSCULAR | Status: AC
  Administered 2022-12-13: 6 mg via INTRAVENOUS
  Filled 2022-12-13: qty 1

## 2022-12-13 MED ORDER — DIPHENHYDRAMINE HCL 50 MG/ML IJ SOLN
12.5000 mg | Freq: Once | INTRAMUSCULAR | Status: AC
  Administered 2022-12-13: 12.5 mg via INTRAVENOUS
  Filled 2022-12-13: qty 1

## 2022-12-13 MED ORDER — PROCHLORPERAZINE MALEATE 10 MG PO TABS: 10.0000 mg | ORAL_TABLET | Freq: Three times a day (TID) | ORAL | 0 refills | Status: AC | PRN

## 2022-12-13 MED ORDER — IOHEXOL 350 MG/ML SOLN
100.0000 mL | Freq: Once | INTRAVENOUS | Status: AC | PRN
  Administered 2022-12-13: 100 mL via INTRAVENOUS

## 2022-12-13 MED ORDER — PROCHLORPERAZINE EDISYLATE 10 MG/2ML IJ SOLN
10.0000 mg | Freq: Once | INTRAMUSCULAR | Status: AC
  Administered 2022-12-13: 10 mg via INTRAVENOUS
  Filled 2022-12-13: qty 2

## 2022-12-13 NOTE — Discharge Instructions (Addendum)
I recommend that you follow-up with a neurologist for your frequent headaches, and also your complex migraine headaches.  These can cause numbness and weakness on the side of your body can mimic stroke symptoms.  If you do have a bad recurring headache, you can take the Compazine tablet as prescribed, along with 600 mg of Advil (ibuprofen).  If you have any new strokelike symptoms, including severe headache, persistent vomiting, loss of vision or blurred vision, or balance problems, please return to the emergency department.

## 2022-12-13 NOTE — ED Provider Notes (Signed)
[facility] Provider Note   CSN: [id number] Arrival date & time: 12/13/22  8469     History {Add pertinent medical, surgical, social history, OB history to HPI:1} Chief Complaint  Patient presents with   Anxiety    Lori Randolph is a 27 y.o. female presenting to the ED with several concerns.  The patient says she predominantly is concerned about a headache has been having for about 3 days and then woke up around 2 AM with left-sided facial numbness, left arm numbness and left leg numbness and weakness.  She says she does get headaches very frequently, perhaps every other day.  It is on a usual for the last several days.  She has not seen a specialist for this.  She does not take medicine other than Tylenol.  She denies that she has a numbness associated with her headaches in the past.  She did report some chest pain and shortness of breath in triage, as well as anxiety, but did not have these concerns for me.  She does have a history of asthma.  Denies recent illness symptoms.  HPI     Home Medications Prior to Admission medications   Medication Sig Start Date End Date Taking? Authorizing Provider  albuterol (PROVENTIL HFA;VENTOLIN HFA) 108 (90 Base) MCG/ACT inhaler Inhale into the lungs every 6 (six) hours as needed for wheezing or shortness of breath.    [provider]  predniSONE (DELTASONE) 10 MG tablet Take 2 tablets (20 mg total) by mouth 2 (two) times daily. Patient not taking: Reported on 04/11/2021 07/28/19   Geoffery Lyons, MD  traMADol (ULTRAM) 50 MG tablet Take 1 tablet (50 mg total) by mouth every 6 (six) hours as needed. Patient not taking: Reported on 04/11/2021 07/28/19   Geoffery Lyons, MD      Allergies    Ibuprofen    Review of Systems   Review of Systems  Physical Exam Updated Vital Signs BP (!) 132/91   Pulse 61   Temp 98.4 F (36.9 C)   Resp 18   Wt 64.4 kg   LMP 12/10/2022 (Exact Date)   SpO2  100%   BMI 21.59 kg/m  Physical Exam Constitutional:      General: She is not in acute distress. HENT:     Head: Normocephalic and atraumatic.  Eyes:     Conjunctiva/sclera: Conjunctivae normal.     Pupils: Pupils are equal, round, and reactive to light.  Cardiovascular:     Rate and Rhythm: Normal rate and regular rhythm.  Pulmonary:     Effort: Pulmonary effort is normal. No respiratory distress.  Abdominal:     General: There is no distension.     Tenderness: There is no abdominal tenderness.  Skin:    General: Skin is warm and dry.  Neurological:     Mental Status: She is alert.     Comments: Left-sided facial paresthesia, no other cranial nerve deficits noted Patient reporting paresthesia of the left arm and leg and has some weak or poor effort on motor testing of left arm and leg  Psychiatric:        Mood and Affect: Mood normal.        Behavior: Behavior normal.     ED Results / Procedures / Treatments   Labs (all labs ordered are listed, but only abnormal results are displayed) Labs Reviewed  BASIC METABOLIC PANEL  CBC  MAGNESIUM  HCG, SERUM, QUALITATIVE    EKG  EKG Interpretation Date/Time:  Wednesday December 13 2022 09:52:54 EST Ventricular Rate:  55 PR Interval:  191 QRS Duration:  77 QT Interval:  411 QTC Calculation: 394 R Axis:   90  Text Interpretation: Sinus rhythm Borderline right axis deviation Confirmed by Alvester Chou [phone]) on 12/13/2022 9:54:48 AM  Radiology No results found.  Procedures Procedures  {Document cardiac monitor, telemetry assessment procedure when appropriate:1}  Medications Ordered in ED Medications  prochlorperazine (COMPAZINE) injection 10 mg (has no administration in time range)  diphenhydrAMINE (BENADRYL) injection 12.5 mg (has no administration in time range)  dexamethasone (DECADRON) injection 6 mg (has no administration in time range)    ED Course/ Medical Decision Making/ A&P   {   Click here for  ABCD2, HEART and other calculatorsREFRESH Note before signing :1}                              Medical Decision Making Amount and/or Complexity of Data Reviewed Labs: ordered. Radiology: ordered.  Risk Prescription drug management.   This patient presents to the ED with concern for headache, left-sided weakness paresthesia. This involves an extensive number of treatment options, and is a complaint that carries with it a high risk of complications and morbidity.  The differential diagnosis includes complex migraine most likely, versus CVA, versus autoimmune condition versus other  She has no significant risk factors for stroke, which would be highly unusual and unlikely at this age and presentation.  However given the frequency of her headaches and now with worsening pattern, I do think a CT scan of the head would be reasonable to evaluate for intracranial lesion or mass.  Lower suspicion for subarachnoid hemorrhage.  No thunderclap or acute onset headache.    I ordered and personally interpreted labs.  The pertinent results include:  ***  I ordered imaging studies including *** I independently visualized and interpreted imaging which showed *** I agree with the radiologist interpretation  The patient was maintained on a cardiac monitor.  I personally viewed and interpreted the cardiac monitored which showed an underlying rhythm of: ***  Per my interpretation the patient's ECG shows ***  I ordered medication including ***  for *** I have reviewed the patients home medicines and have made adjustments as needed  Test Considered: ***  I requested consultation with the ***,  and discussed lab and imaging findings as well as pertinent plan - they recommend: ***  After the interventions noted above, I reevaluated the patient and found that they have: {resolved/improved/worsened:23923::"improved"}  Social Determinants of Health:***  Dispostion:  After consideration of the diagnostic  results and the patients response to treatment, I feel that the patent would benefit from ***.   {Document critical care time when appropriate:1} {Document review of labs and clinical decision tools ie heart score, Chads2Vasc2 etc:1}  {Document your independent review of radiology images, and any outside records:1} {Document your discussion with family members, caretakers, and with consultants:1} {Document social determinants of health affecting pt's care:1} {Document your decision making why or why not admission, treatments were needed:1} Final Clinical Impression(s) / ED Diagnoses Final diagnoses:  None    Rx / DC Orders ED Discharge Orders     None

## 2022-12-13 NOTE — ED Triage Notes (Signed)
Hx anxiety , face ,  left arm and leg tingling. Chest pain and shortness of breath . Denies cough or congestion .
